# Patient Record
Sex: Male | Born: 1960
Health system: Southern US, Community
[De-identification: ages and names within clinical notes are randomized; demographics above are authoritative.]

## PROBLEM LIST (undated history)

## (undated) DIAGNOSIS — F329 Major depressive disorder, single episode, unspecified: Secondary | ICD-10-CM

## (undated) DIAGNOSIS — F319 Bipolar disorder, unspecified: Secondary | ICD-10-CM

## (undated) DIAGNOSIS — I739 Peripheral vascular disease, unspecified: Secondary | ICD-10-CM

## (undated) DIAGNOSIS — J020 Streptococcal pharyngitis: Secondary | ICD-10-CM

## (undated) DIAGNOSIS — E785 Hyperlipidemia, unspecified: Secondary | ICD-10-CM

## (undated) DIAGNOSIS — J189 Pneumonia, unspecified organism: Secondary | ICD-10-CM

## (undated) DIAGNOSIS — G459 Transient cerebral ischemic attack, unspecified: Secondary | ICD-10-CM

## (undated) DIAGNOSIS — N529 Male erectile dysfunction, unspecified: Secondary | ICD-10-CM

## (undated) DIAGNOSIS — K760 Fatty (change of) liver, not elsewhere classified: Secondary | ICD-10-CM

## (undated) DIAGNOSIS — K635 Polyp of colon: Secondary | ICD-10-CM

## (undated) DIAGNOSIS — F1910 Other psychoactive substance abuse, uncomplicated: Secondary | ICD-10-CM

## (undated) DIAGNOSIS — I2510 Atherosclerotic heart disease of native coronary artery without angina pectoris: Secondary | ICD-10-CM

## (undated) DIAGNOSIS — R7989 Other specified abnormal findings of blood chemistry: Secondary | ICD-10-CM

## (undated) DIAGNOSIS — H6690 Otitis media, unspecified, unspecified ear: Secondary | ICD-10-CM

## (undated) DIAGNOSIS — K219 Gastro-esophageal reflux disease without esophagitis: Secondary | ICD-10-CM

## (undated) DIAGNOSIS — F419 Anxiety disorder, unspecified: Secondary | ICD-10-CM

## (undated) DIAGNOSIS — E781 Pure hyperglyceridemia: Secondary | ICD-10-CM

## (undated) DIAGNOSIS — Z8489 Family history of other specified conditions: Secondary | ICD-10-CM

## (undated) DIAGNOSIS — F32A Depression, unspecified: Secondary | ICD-10-CM

## (undated) DIAGNOSIS — R519 Headache, unspecified: Secondary | ICD-10-CM

## (undated) DIAGNOSIS — I10 Essential (primary) hypertension: Secondary | ICD-10-CM

## (undated) DIAGNOSIS — R2232 Localized swelling, mass and lump, left upper limb: Secondary | ICD-10-CM

## (undated) HISTORY — PX: LAMINECTOMY: SHX219

## (undated) HISTORY — DX: Localized swelling, mass and lump, left upper limb: R22.32

## (undated) HISTORY — DX: Bipolar disorder, unspecified: F31.9

## (undated) HISTORY — DX: Hyperlipidemia, unspecified: E78.5

## (undated) HISTORY — PX: TONSILLECTOMY: SUR1361

## (undated) HISTORY — PX: HERNIA REPAIR: SHX51

## (undated) HISTORY — PX: APPENDECTOMY: SHX54

## (undated) HISTORY — DX: Streptococcal pharyngitis: J02.0

## (undated) HISTORY — DX: Headache, unspecified: R51.9

## (undated) HISTORY — DX: Other specified abnormal findings of blood chemistry: R79.89

## (undated) HISTORY — DX: Otitis media, unspecified, unspecified ear: H66.90

## (undated) HISTORY — DX: Gastro-esophageal reflux disease without esophagitis: K21.9

## (undated) HISTORY — DX: Polyp of colon: K63.5

## (undated) HISTORY — PX: OTHER SURGICAL HISTORY: SHX169

## (undated) HISTORY — DX: Male erectile dysfunction, unspecified: N52.9

## (undated) HISTORY — DX: Fatty (change of) liver, not elsewhere classified: K76.0

## (undated) HISTORY — DX: Atherosclerotic heart disease of native coronary artery without angina pectoris: I25.10

## (undated) HISTORY — DX: Pure hyperglyceridemia: E78.1

## (undated) HISTORY — DX: Transient cerebral ischemic attack, unspecified: G45.9

---

## 1999-10-16 ENCOUNTER — Encounter: Payer: Self-pay | Admitting: Family Medicine

## 1999-10-16 ENCOUNTER — Ambulatory Visit (HOSPITAL_COMMUNITY): Admission: RE | Admit: 1999-10-16 | Discharge: 1999-10-16 | Payer: Self-pay | Admitting: Family Medicine

## 2005-07-27 ENCOUNTER — Ambulatory Visit (HOSPITAL_COMMUNITY): Admission: RE | Admit: 2005-07-27 | Discharge: 2005-07-27 | Payer: Self-pay | Admitting: Orthopedic Surgery

## 2007-07-07 ENCOUNTER — Emergency Department (HOSPITAL_COMMUNITY): Admission: EM | Admit: 2007-07-07 | Discharge: 2007-07-07 | Payer: Self-pay | Admitting: Emergency Medicine

## 2010-10-11 ENCOUNTER — Encounter: Admission: RE | Admit: 2010-10-11 | Discharge: 2010-10-11 | Payer: Self-pay | Admitting: Family Medicine

## 2013-01-08 ENCOUNTER — Emergency Department (HOSPITAL_COMMUNITY)
Admission: EM | Admit: 2013-01-08 | Discharge: 2013-01-09 | Disposition: A | Discharge status: Other Acute Inpatient Hospital | Payer: 59 | Attending: Emergency Medicine | Admitting: Emergency Medicine

## 2013-01-08 ENCOUNTER — Encounter (HOSPITAL_COMMUNITY): Payer: Self-pay | Admitting: Emergency Medicine

## 2013-01-08 DIAGNOSIS — F319 Bipolar disorder, unspecified: Secondary | ICD-10-CM | POA: Insufficient documentation

## 2013-01-08 DIAGNOSIS — I10 Essential (primary) hypertension: Secondary | ICD-10-CM | POA: Insufficient documentation

## 2013-01-08 DIAGNOSIS — F314 Bipolar disorder, current episode depressed, severe, without psychotic features: Secondary | ICD-10-CM

## 2013-01-08 DIAGNOSIS — F329 Major depressive disorder, single episode, unspecified: Secondary | ICD-10-CM | POA: Insufficient documentation

## 2013-01-08 DIAGNOSIS — F3289 Other specified depressive episodes: Secondary | ICD-10-CM | POA: Insufficient documentation

## 2013-01-08 DIAGNOSIS — R45851 Suicidal ideations: Secondary | ICD-10-CM

## 2013-01-08 DIAGNOSIS — F3130 Bipolar disorder, current episode depressed, mild or moderate severity, unspecified: Secondary | ICD-10-CM

## 2013-01-08 HISTORY — DX: Depression, unspecified: F32.A

## 2013-01-08 HISTORY — DX: Major depressive disorder, single episode, unspecified: F32.9

## 2013-01-08 HISTORY — DX: Essential (primary) hypertension: I10

## 2013-01-08 LAB — CBC
HCT: 45.7 % (ref 39.0–52.0)
Hemoglobin: 15.9 g/dL (ref 13.0–17.0)
MCH: 32.1 pg (ref 26.0–34.0)
MCHC: 34.8 g/dL (ref 30.0–36.0)
MCV: 92.3 fL (ref 78.0–100.0)
Platelets: 254 10*3/uL (ref 150–400)
RBC: 4.95 MIL/uL (ref 4.22–5.81)
RDW: 12.2 % (ref 11.5–15.5)
WBC: 7.6 10*3/uL (ref 4.0–10.5)

## 2013-01-08 LAB — COMPREHENSIVE METABOLIC PANEL
ALT: 28 U/L (ref 0–53)
AST: 20 U/L (ref 0–37)
Albumin: 4 g/dL (ref 3.5–5.2)
Alkaline Phosphatase: 49 U/L (ref 39–117)
Calcium: 9.7 mg/dL (ref 8.4–10.5)
GFR calc Af Amer: >90 mL/min (ref >90)
Potassium: 4.8 mEq/L (ref 3.5–5.1)
Sodium: 137 mEq/L (ref 135–145)
Total Protein: 7.3 g/dL (ref 6.0–8.3)

## 2013-01-08 LAB — RAPID URINE DRUG SCREEN, HOSP PERFORMED
Amphetamines: NOT DETECTED
Barbiturates: NOT DETECTED
Benzodiazepines: NOT DETECTED
Cocaine: NOT DETECTED
Tetrahydrocannabinol: NOT DETECTED

## 2013-01-08 MED ORDER — IBUPROFEN 600 MG PO TABS
600.0000 mg | ORAL_TABLET | Freq: Three times a day (TID) | ORAL | Status: DC | PRN
  Administered 2013-01-08: 600 mg via ORAL
  Filled 2013-01-08: qty 1

## 2013-01-08 MED ORDER — ACETAMINOPHEN 325 MG PO TABS: 650.0000 mg | ORAL_TABLET | ORAL | Status: DC | PRN

## 2013-01-08 MED ORDER — LORAZEPAM 1 MG PO TABS
1.0000 mg | ORAL_TABLET | Freq: Three times a day (TID) | ORAL | Status: DC | PRN
  Administered 2013-01-08 (×2): 1 mg via ORAL
  Filled 2013-01-08 (×2): qty 1

## 2013-01-08 MED ORDER — ZOLPIDEM TARTRATE 5 MG PO TABS
5.0000 mg | ORAL_TABLET | Freq: Every evening | ORAL | Status: DC | PRN
  Administered 2013-01-08: 5 mg via ORAL
  Filled 2013-01-08: qty 1

## 2013-01-08 NOTE — ED Notes (Signed)
Report to Eric Rn.

## 2013-01-08 NOTE — ED Notes (Signed)
Pt presenting to ed with c/o suicidal thoughts pt states he was going to take a [place] baster needle and stick his femoral artery and get in the tub and bleed out. Pt states he also thought of hanging himself from the power cord in his attic. Pt states he has 3 words heart attack, stroke and cancer that he thinks would take him out and every morning he wakes up and wishes God would take him out. Pt states I don't pity anyone that has cancer I feel lucky for them pt states because I wish I was in that situation. pt states he's been off medications for a while. 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

## 2013-01-08 NOTE — ED Notes (Signed)
pts watch, wallet, wedding band and keys released to his sister per his request

## 2013-01-08 NOTE — BHH Counselor (Signed)
Per C.C. @[facility] , pt has been accepted for treatment.  Accepting physician, Dr. Betti Cruz.  Pt to be transported to [facility] for treatment, call report [phone].

## 2013-01-08 NOTE — Consult Note (Signed)
Reason for Consult: depression with suicidal ideations and plan Referring Physician: Dr. Wille Glaser Scott Stuart is an 52 y.o. male.  HPI: Patient was seen and chart reviewed. Patient has presented to the [facility] long emergency department from [facility] for depression and suicidal ideation with a plan. Patient reported he has been depressed over 2 weeks, stressed about his financial situation and nursing school at the [employer] which is stressful to him. Patient has lost of interest, isolation and a frequent and repeated suicidal thoughts. Patient has no previous history of suicide attempts. Patient has a history of for drinking alcohol, especially beer, drinks up from 10 beers a day to a case a day. Reportedly his father deceased at 08/01/10 who suffered with alcoholism. Patient has no previous the alcohol detox treatments or rehabilitation services. Patient was relocated from [place] to [place] in 1994 and briefly stayed in [place]. Patient has twice previously divorced and currently living with the his wife Olegario Messier since 1996. Patient has no children from this relationship. Patient has a 3 children, ages 61, 57 and 81 from the first relationship but has limited communication/ contact with them. Patient has one previous acute psychiatric hospitalization in [place] during 1994 after divorcing his second wife and lost his the job. 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 Patient was received outpatient psychiatric services [facility] the [facility] 2013, but noncompliant after 4 months.  MSE: Patient was well developed, well nourished and well built, Caucasian male, dressed in a hospital gown. Awake, alert, oriented to time, place, person and situation. He stated mood was depression sadness, lack of interest, feeling empty and his affect was bright and full. Has normal speech and thought process. He  has a suicidal ideation with the intention son plan of bleeding to death. Patient has knowledge about blood vessels. Patient has no homicidal ideation, intention, or plan is no evidence of psychotic symptoms.  Past Medical History  Diagnosis Date  . Depression   . Hypertension     Past Surgical History  Procedure Date  . Tonsillectomy   . Appendectomy     No family history on file.  Social History:  reports that he has never smoked. He does not have any smokeless tobacco history on file. He reports that he drinks alcohol. He reports that he does not use illicit drugs.  Allergies: No Known Allergies  Medications: I have reviewed the patient's current medications.  Results for orders placed during the hospital encounter of 01/08/13 (from the past 48 hour(s))  URINE RAPID DRUG SCREEN (HOSP PERFORMED)     Status: Normal   Collection Time   01/08/13 12:29 PM      Component Value Range Comment   Opiates NONE DETECTED  NONE DETECTED    Cocaine NONE DETECTED  NONE DETECTED    Benzodiazepines NONE DETECTED  NONE DETECTED    Amphetamines NONE DETECTED  NONE DETECTED    Tetrahydrocannabinol NONE DETECTED  NONE DETECTED    Barbiturates NONE DETECTED  NONE DETECTED   CBC     Status: Normal   Collection Time   01/08/13 12:50 PM      Component Value Range Comment   WBC 7.6  4.0 - 10.5 K/uL    RBC 4.95  4.22 - 5.81 MIL/uL    Hemoglobin 15.9  13.0 - 17.0 g/dL    HCT 16.1  09.6 - 04.5 %  MCV 92.3  78.0 - 100.0 fL    MCH 32.1  26.0 - 34.0 pg    MCHC 34.8  30.0 - 36.0 g/dL    RDW 98.1  19.1 - 47.8 %    Platelets 254  150 - 400 K/uL   COMPREHENSIVE METABOLIC PANEL     Status: Normal   Collection Time   01/08/13 12:50 PM      Component Value Range Comment   Sodium 137  135 - 145 mEq/L    Potassium 4.8  3.5 - 5.1 mEq/L    Chloride 104  96 - 112 mEq/L    CO2 22  19 - 32 mEq/L    Glucose, Bld 91  70 - 99 mg/dL    BUN 20  6 - 23 mg/dL    Creatinine, Ser 2.95  0.50 - 1.35 mg/dL     Calcium 9.7  8.4 - 10.5 mg/dL    Total Protein 7.3  6.0 - 8.3 g/dL    Albumin 4.0  3.5 - 5.2 g/dL    AST 20  0 - 37 U/L    ALT 28  0 - 53 U/L    Alkaline Phosphatase 49  39 - 117 U/L    Total Bilirubin 0.3  0.3 - 1.2 mg/dL    GFR calc non Af Amer >90  >90 mL/min    GFR calc Af Amer >90  >90 mL/min   ETHANOL     Status: Normal   Collection Time   01/08/13 12:50 PM      Component Value Range Comment   Alcohol, Ethyl (B) <11  0 - 11 mg/dL     No results found.  Positive for bad mood, bipolar, excessive alcohol consumption, mood swings, school difficulties and sleep disturbance Blood pressure 140/99, pulse 72, temperature 98.4 F (36.9 C), temperature source Oral, resp. rate 20, SpO2 100.00%.   Assessment/Plan: Bipolar disorder post recent episode is depression with suicidal ideation. Noncompliant with medications.  Recommended acute psychiatric hospitalization for crisis stabilization and appropriate medication management and outpatient psychiatric services.   Vail Basista,JANARDHAHA R. 01/08/2013, 4:10 PM

## 2013-01-08 NOTE — BH Assessment (Signed)
Assessment Note   Scott Stuart is a 52 y.o. male who presents to [facility] with SI/Depression/Anxiety.  Pt tells this Clinical research associate he has been praying for death via heart attack, cancer, or stroke.  Pt has been having SI thoughts x73yrs w/pan to hang self with extension cord and inject "[place] baster needle" in his femoral artery and bleed out.  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  Pt has been off meds since 07/2011 and recently returned to outpatient psychiatrist([facility]) on 01/08/13 who suggested that patient come to the ed for a psych eval.  Pt describes depression: anhedonia, inability to complete simple tasks or make decisions, isolation, daily crying spells.  Pt has past admissions with [facility] in [phone]).  Pt confirms---"I will eventually kill myself". Pt has good support from spouse, sister and brother-in-law.     Axis I: Major Depression, Recurrent severe Axis II: Deferred Axis III:  Past Medical History  Diagnosis Date  . Depression   . Hypertension    Axis IV: economic problems, educational problems, other psychosocial or environmental problems and problems related to social environment Axis V: 31-40 impairment in reality testing  Past Medical History:  Past Medical History  Diagnosis Date  . Depression   . Hypertension     Past Surgical History  Procedure Date  . Tonsillectomy   . Appendectomy     Family History: No family history on file.  Social History:  reports that he has never smoked. He does not have any smokeless tobacco history on file. He reports that he does not drink alcohol or use illicit drugs.  Additional Social History:  Alcohol / Drug Use Pain Medications: None  Prescriptions: None  Over the Counter: None  History of alcohol / drug use?: Yes Longest period of sobriety (when/how long): Past hx of heavy alcohol use   CIWA: CIWA-Ar BP: 139/83 mmHg Pulse Rate: 65  COWS:    Allergies: No Known  Allergies  Home Medications:  (Not in a hospital admission)  OB/GYN Status:  No LMP for male patient.  General Assessment Data Location of Assessment: [facility] Living Arrangements: Spouse/significant other (Lives with spouse ) Can pt return to current living arrangement?: Yes Admission Status: Voluntary Is patient capable of signing voluntary admission?: Yes Transfer from: Acute Hospital Referral Source: MD  Education Status Is patient currently in school?: Yes Current Grade: Community College  Highest grade of school patient has completed: High School  Name of school: Nurse, adult person: None   Risk to self Suicidal Ideation: Yes-Currently Present Suicidal Intent: Yes-Currently Present Is patient at risk for suicide?: Yes Suicidal Plan?: Yes-Currently Present Specify Current Suicidal Plan: Hang Self; Inject Needle into Femoral Atery(bleed out) Access to Means: Yes Specify Access to Suicidal Means: Ropes, Extension Cords, Sharps What has been your use of drugs/alcohol within the last 12 months?: Pt. Denies  Previous Attempts/Gestures: No (Thoughts only ) How many times?: 0  Other Self Harm Risks: None  Triggers for Past Attempts: None known Intentional Self Injurious Behavior: None Family Suicide History: Yes (Mother attemtped SI ) Recent stressful life event(s): Financial Problems;Other (Comment) (Issues with nursing school) Persecutory voices/beliefs?: No Depression: Yes Depression Symptoms: Tearfulness;Isolating;Loss of interest in usual pleasures;Feeling worthless/self pity Substance abuse history and/or treatment for substance abuse?: Yes Suicide prevention information given to non-admitted patients: Not applicable  Risk to Others Homicidal Ideation: No Thoughts of Harm to Others: No Current Homicidal Intent: No Current Homicidal Plan: No Access to Homicidal Means:  No Describe Access to Homicidal Means: None  Identified Victim: None  History of harm to  others?: No Assessment of Violence: None Noted Violent Behavior Description: None  Does patient have access to weapons?: No Criminal Charges Pending?: No Does patient have a court date: No  Psychosis Hallucinations: None noted Delusions: None noted  Mental Status Report Appear/Hygiene: Other (Comment) (Appropriate ) Eye Contact: Good Motor Activity: Unremarkable Speech: Logical/coherent;Pressured;Loud Level of Consciousness: Alert Mood: Depressed;Anxious;Sad Affect: Depressed;Sad Anxiety Level: Minimal Thought Processes: Coherent;Relevant Judgement: Impaired Orientation: Person;Place;Time;Situation Obsessive Compulsive Thoughts/Behaviors: None  Cognitive Functioning Concentration: Normal Memory: Recent Intact;Remote Intact IQ: Average Insight: Poor Impulse Control: Poor Appetite: Good Weight Loss: 0  Weight Gain: 0  Sleep: Decreased Total Hours of Sleep: 5  Vegetative Symptoms: None  ADLScreening [facility] Assessment Services) Patient's cognitive ability adequate to safely complete daily activities?: Yes Patient able to express need for assistance with ADLs?: Yes Independently performs ADLs?: Yes (appropriate for developmental age)  Abuse/Neglect [facility]) Physical Abuse: Denies Verbal Abuse: Yes, past (Comment) (Past hx from father ) Sexual Abuse: Denies  Prior Inpatient Therapy Prior Inpatient Therapy: Yes Prior Therapy Dates: 1992 Prior Therapy Facilty/Provider(s): [facility]  Reason for Treatment: Depression/SI   Prior Outpatient Therapy Prior Outpatient Therapy: Yes Prior Therapy Dates: Current  Prior Therapy Facilty/Provider(s): [facility]  Reason for Treatment: Med Mgt   ADL Screening (condition at time of admission) Patient's cognitive ability adequate to safely complete daily activities?: Yes Patient able to express need for assistance with ADLs?: Yes Independently performs ADLs?: Yes (appropriate for developmental age) Weakness of  Legs: None Weakness of Arms/Hands: None  Home Assistive Devices/Equipment Home Assistive Devices/Equipment: None  Therapy Consults (therapy consults require a physician order) PT Evaluation Needed: No OT Evalulation Needed: No SLP Evaluation Needed: No Abuse/Neglect Assessment (Assessment to be complete while patient is alone) Physical Abuse: Denies Verbal Abuse: Yes, past (Comment) (Past hx from father ) Sexual Abuse: Denies Exploitation of patient/patient's resources: Denies Self-Neglect: Denies Values / Beliefs Cultural Requests During Hospitalization: None Spiritual Requests During Hospitalization: None Consults Spiritual Care Consult Needed: No Social Work Consult Needed: No Merchant navy officer (For Healthcare) Advance Directive: Patient does not have advance directive;Patient would not like information Pre-existing out of facility DNR order (yellow form or pink MOST form): No Nutrition Screen- [facility] Patient's home diet: Regular Have you recently lost weight without trying?: No Have you been eating poorly because of a decreased appetite?: No Malnutrition Screening Tool Score: 0   Additional Information 1:1 In Past 12 Months?: No CIRT Risk: No Elopement Risk: No Does patient have medical clearance?: Yes     Disposition:  Disposition Disposition of Patient: Inpatient treatment program;Referred to [facility] ) Type of inpatient treatment program: Adult Patient referred to: Other (Comment) ([facility] )  On Site Evaluation by:   Reviewed with Physician:     Murrell Redden 01/08/2013 10:18 PM

## 2013-01-08 NOTE — ED Provider Notes (Signed)
History     CSN: [id number]  Arrival date & time 01/08/13  1158   First MD Initiated Contact with Patient 01/08/13 1210      Chief Complaint  Patient presents with  . Medical Clearance    (Consider location/radiation/quality/duration/timing/severity/associated sxs/prior treatment) The history is provided by the patient.  pt with hx bipolar disorder w feelings of depression and suicide for past few weeks. States has many stressors, school, money. Also has issues w etoh abuse, states drinks 1-2 cases of beer per week. When stops, denies problems w etoh withdrawal, shakes, seizures, nv or dts. No other substance abuse problems. 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 States has thought of ways to kill self, but never has tried to harm self. States has been on meds for same in past intermittently for brief intervals but has always become non compliant w rx. States recent physical health at baseline. No new c/o or symptoms.     Past Medical History  Diagnosis Date  . Depression   . Hypertension     Past Surgical History  Procedure Date  . Tonsillectomy   . Appendectomy     No family history on file.  History  Substance Use Topics  . Smoking status: Never Smoker   . Smokeless tobacco: Not on file  . Alcohol Use: Yes     Comment: weekends      Review of Systems  Constitutional: Negative for fever.  HENT: Negative for neck pain.   Eyes: Negative for redness.  Respiratory: Negative for shortness of breath.   Cardiovascular: Negative for chest pain.  Gastrointestinal: Negative for abdominal pain.  Genitourinary: Negative for flank pain.  Musculoskeletal: Negative for back pain.  Skin: Negative for rash.  Neurological: Negative for headaches.  Hematological: Does not bruise/bleed easily.  Psychiatric/Behavioral: Positive for dysphoric mood.    Allergies  Review of patient's allergies indicates no  known allergies.  Home Medications  No current outpatient prescriptions on file.  BP 140/99  Pulse 72  Temp 98.4 F (36.9 C) (Oral)  Resp 20  SpO2 100%  Physical Exam  Nursing note and vitals reviewed. Constitutional: He is oriented to person, place, and time. He appears well-developed and well-nourished. No distress.  HENT:  Nose: Nose normal.  Mouth/Throat: Oropharynx is clear and moist.  Eyes: Conjunctivae normal are normal. No scleral icterus.  Neck: Neck supple. No tracheal deviation present.  Cardiovascular: Normal rate, regular rhythm, normal heart sounds and intact distal pulses.   Pulmonary/Chest: Effort normal and breath sounds normal. No accessory muscle usage. No respiratory distress.  Abdominal: Soft. Bowel sounds are normal. He exhibits no distension. There is no tenderness.  Musculoskeletal: Normal range of motion. He exhibits no edema and no tenderness.  Neurological: He is alert and oriented to person, place, and time.  Skin: Skin is warm and dry.  Psychiatric:       Pt moves rapidly from one thought to next, rapid speech. States feels depressed and has suicidal thoughts.     ED Course  Procedures (including critical care time)  Results for orders placed during the hospital encounter of 01/08/13  CBC      Component Value Range   WBC 7.6  4.0 - 10.5 K/uL   RBC 4.95  4.22 - 5.81 MIL/uL   Hemoglobin 15.9  13.0 - 17.0 g/dL   HCT 40.9  81.1 - 91.4 %   MCV 92.3  78.0 -  100.0 fL   MCH 32.1  26.0 - 34.0 pg   MCHC 34.8  30.0 - 36.0 g/dL   RDW 11.9  14.7 - 82.9 %   Platelets 254  150 - 400 K/uL  COMPREHENSIVE METABOLIC PANEL      Component Value Range   Sodium 137  135 - 145 mEq/L   Potassium 4.8  3.5 - 5.1 mEq/L   Chloride 104  96 - 112 mEq/L   CO2 22  19 - 32 mEq/L   Glucose, Bld 91  70 - 99 mg/dL   BUN 20  6 - 23 mg/dL   Creatinine, Ser 5.62  0.50 - 1.35 mg/dL   Calcium 9.7  8.4 - 13.0 mg/dL   Total Protein 7.3  6.0 - 8.3 g/dL   Albumin 4.0  3.5 - 5.2  g/dL   AST 20  0 - 37 U/L   ALT 28  0 - 53 U/L   Alkaline Phosphatase 49  39 - 117 U/L   Total Bilirubin 0.3  0.3 - 1.2 mg/dL   GFR calc non Af Amer >90  >90 mL/min   GFR calc Af Amer >90  >90 mL/min  ETHANOL      Component Value Range   Alcohol, Ethyl (B) <11  0 - 11 mg/dL  URINE RAPID DRUG SCREEN (HOSP PERFORMED)      Component Value Range   Opiates NONE DETECTED  NONE DETECTED   Cocaine NONE DETECTED  NONE DETECTED   Benzodiazepines NONE DETECTED  NONE DETECTED   Amphetamines NONE DETECTED  NONE DETECTED   Tetrahydrocannabinol NONE DETECTED  NONE DETECTED   Barbiturates NONE DETECTED  NONE DETECTED        MDM  Labs.   Act called. Act and Dr Shela Commons to see.  Reviewed nursing notes and prior charts for additional history.   Will sign out to oncoming md that pt needs psych eval/placement.         Suzi Roots, MD 01/08/13 [phone]

## 2013-01-09 ENCOUNTER — Emergency Department (HOSPITAL_COMMUNITY): Payer: 59

## 2013-01-09 LAB — CBC WITH DIFFERENTIAL/PLATELET
Basophils Absolute: 0 10*3/uL (ref 0.0–0.1)
Basophils Relative: 0 % (ref 0–1)
HCT: 45.2 % (ref 39.0–52.0)
MCHC: 34.7 g/dL (ref 30.0–36.0)
Monocytes Absolute: 0.5 10*3/uL (ref 0.1–1.0)
Neutro Abs: 4.4 10*3/uL (ref 1.7–7.7)
Platelets: 229 10*3/uL (ref 150–400)
RDW: 12.3 % (ref 11.5–15.5)

## 2013-01-09 LAB — COMPREHENSIVE METABOLIC PANEL
AST: 17 U/L (ref 0–37)
Albumin: 3.5 g/dL (ref 3.5–5.2)
Calcium: 9.6 mg/dL (ref 8.4–10.5)
Chloride: 102 mEq/L (ref 96–112)
Creatinine, Ser: 1.08 mg/dL (ref 0.50–1.35)
Sodium: 137 mEq/L (ref 135–145)
Total Bilirubin: 0.4 mg/dL (ref 0.3–1.2)

## 2013-01-09 NOTE — ED Notes (Signed)
Passed out lunch trays 

## 2013-01-09 NOTE — ED Provider Notes (Signed)
Pt seen and examined, now with ruq pain, suspect cholelitihias , will order w/u  Toy Baker, MD 01/09/13 1105

## 2013-01-09 NOTE — ED Notes (Signed)
Dr allen into see 

## 2013-01-09 NOTE — ED Notes (Signed)
eating lunch, sister at bedside

## 2013-01-09 NOTE — ED Notes (Signed)
Picked up breakfast trays 

## 2013-01-09 NOTE — ED Notes (Signed)
Pt is aware that he will be having a Abd [place] and not to eat or drink anything until complete, ACT also aware, pt is pending for [facility]

## 2013-01-09 NOTE — ED Notes (Signed)
ACT into see 

## 2013-01-09 NOTE — ED Notes (Signed)
Pt ambuloatory to [facility]CU for [place], NT w/ pt

## 2013-01-09 NOTE — ED Notes (Signed)
[facility] contacted [place], ACT aware and into talk w/ pt

## 2013-01-09 NOTE — ED Notes (Signed)
Up on the phone 

## 2013-01-09 NOTE — ED Notes (Signed)
Up tot he bathroom to shower and change scrubs 

## 2013-01-09 NOTE — ED Notes (Signed)
Eating supper.

## 2013-01-09 NOTE — ED Notes (Signed)
Lab in to draw

## 2013-01-09 NOTE — ED Notes (Signed)
Up to the desk on the phone 

## 2013-01-09 NOTE — ED Provider Notes (Addendum)
Accepted at [facility].  The patient's wife will take him as [facility]tar was unable to justify this transport.  I spoke with Act who is comfortable with as well.  Geoffery Lyons, MD 01/09/13 1614  Geoffery Lyons, MD 01/09/13 1911

## 2013-01-09 NOTE — BHH Counselor (Signed)
Writer consulted with Dr. Celesta Aver, EDP and confirmed that the pt is allowed to be transported by his wife to [facility], permission was granted. Called [facility] and spoke with Christiane Ha and was told "as long as he is willing to sing himself in, it ok for his wife to bring him here. We're open 24/7 and would like him here before 12 midnight so the nurses can get him checked in". Writer spoke with the pt's wife and confirmed that she will drive the pt to [facility]. Pt was informed that his wife will drive him to [facility]. Pt will sign a no harm contract upon d/c. Ranae Pila, LCAS

## 2013-01-09 NOTE — ED Notes (Signed)
Pt dc'd w/ written dc instructions.  Pt instructed to go directly to [facility] for admission, no harm/violence  contart signed by pt.  Wife/friend to drive pt to [facility] and is here.  Elnita Maxwell at [facility] contacted and is aware that the pt will be leaving shortly and advised that pt need to go to intake  In the [place] building.  Pt and wife are aware, driving directions and contact numbers at [facility] given.  Belongings returned after leaving the unit.  Pt ambulatory w/o difficulty.  Wife and friend meet Pt in waiting room. Transfer chart/MAR/labs/face sheet in sealed envelope for pt take to [facility].

## 2013-01-09 NOTE — ED Notes (Signed)
Pt angry about transport problems, is aware that [facility] is working to resolve them.

## 2013-01-09 NOTE — ED Notes (Signed)
Wife is here for transport

## 2013-01-09 NOTE — ED Notes (Signed)
Pt's wife will transport pt to [facility] per ACT, pt will sign a Engineer, manufacturing systems, OK'd by EDP.  Pt calm, smiling, watching tv, and is aware

## 2013-10-17 ENCOUNTER — Emergency Department (INDEPENDENT_AMBULATORY_CARE_PROVIDER_SITE_OTHER)
Admission: EM | Admit: 2013-10-17 | Discharge: 2013-10-17 | Disposition: A | Discharge status: Home / Self Care | Payer: 59 | Source: Home / Self Care | Attending: Family Medicine | Admitting: Family Medicine

## 2013-10-17 ENCOUNTER — Encounter: Payer: Self-pay | Admitting: Emergency Medicine

## 2013-10-17 DIAGNOSIS — B309 Viral conjunctivitis, unspecified: Secondary | ICD-10-CM

## 2013-10-17 NOTE — ED Provider Notes (Signed)
CSN: [id number]     Arrival date & time 10/17/13  1339 History   First MD Initiated Contact with Patient 10/17/13 1501     Chief Complaint  Patient presents with  . Eye Drainage    some eye drainage for 1 day      HPI Comments: Patient states that he noticed mild painless redness in his left eye yesterday.  He denies a foreign body sensation, and is not sensitive to light.  He has had a slight increase in watery lacrimation from his left eye.  The symptoms have only partly improved with Visine.  No sore throat, nasal congestion, or other URI symptoms.  No history of seasonal rhinitis.  Patient is a 52 y.o. male presenting with conjunctivitis. The history is provided by the patient.  Conjunctivitis The current episode started yesterday. The problem occurs constantly. The problem has not changed since onset.Pertinent negatives include no headaches. Nothing aggravates the symptoms. Nothing relieves the symptoms. Treatments tried: Visine. The treatment provided mild relief.    Past Medical History  Diagnosis Date  . Depression   . Hypertension    Past Surgical History  Procedure Laterality Date  . Tonsillectomy    . Appendectomy     Family History  Problem Relation Age of Onset  . Stroke Father    History  Substance Use Topics  . Smoking status: Never Smoker   . Smokeless tobacco: Not on file  . Alcohol Use: No     Comment: No longer drinks alcohol     Review of Systems  Neurological: Negative for headaches.  All other systems reviewed and are negative.    Allergies  Review of patient's allergies indicates no known allergies.  Home Medications  No current outpatient prescriptions on file. BP 125/86  Pulse 74  Temp(Src) 98.2 F (36.8 C) (Oral)  Ht 6\' 1"  (1.854 m)  Wt 216 lb (97.977 kg)  BMI 28.5 kg/m2  SpO2 97% Physical Exam Nursing notes and Vital Signs reviewed. Appearance:  Patient appears healthy, stated age, and in no acute distress Eyes:  Pupils are equal,  round, and reactive to light and accomodation.  Extraocular movement is intact.  Left conjunctivae slightly hyperemic.  Right conjunctivae normal.  Fundi normal.  No eyelid tenderness or swelling.  Left lid eversion reveals no foreign bodies.  Fluorescein shows no uptake left cornea.  No photophobia. Ears:  Canals normal.  Tympanic membranes normal.  Nose:   Normal turbinates.  No sinus tenderness. Marland Kitchen  Pharynx:  Normal Neck:  Supple.  Right posterior nodes are slightly enlarged, and left posterior nodes are slightly enlarged and tender to palpation  Lungs:  Clear to auscultation.  Breath sounds are equal.  Heart:  Regular rate and rhythm without murmurs, rubs, or gallops.  Skin:  No rash present.   ED Course  Procedures  none       MDM   1. Viral conjunctivitis     May place refrigerated lubricating drops (e.g. "Refresh Tears") in eye several times daily. Followup with ophthalmologist if not improving 3 days.    Lattie Haw, MD 10/18/13 2014

## 2013-10-17 NOTE — ED Notes (Signed)
Scott Stuart complains of redness and some discharge in his left eye for 1 day. Denies fever, chills or sweats.

## 2013-10-18 ENCOUNTER — Encounter: Payer: Self-pay | Admitting: Emergency Medicine

## 2013-10-26 ENCOUNTER — Telehealth: Payer: Self-pay | Admitting: Emergency Medicine

## 2016-01-14 HISTORY — PX: TRIGGER FINGER RELEASE: SHX641

## 2016-11-02 DIAGNOSIS — M67442 Ganglion, left hand: Secondary | ICD-10-CM | POA: Insufficient documentation

## 2016-11-13 HISTORY — PX: FINGER GANGLION CYST EXCISION: SHX1636

## 2016-11-13 HISTORY — PX: STEROID INJECTION TO SCAR: SHX2447

## 2016-12-24 DIAGNOSIS — G459 Transient cerebral ischemic attack, unspecified: Secondary | ICD-10-CM

## 2016-12-24 HISTORY — DX: Transient cerebral ischemic attack, unspecified: G45.9

## 2017-05-25 LAB — POCT GLUCOSE (DEVICE FOR HOME USE)

## 2017-05-30 ENCOUNTER — Encounter: Payer: Self-pay | Admitting: Vascular Surgery

## 2017-05-31 ENCOUNTER — Encounter: Payer: Self-pay | Admitting: Vascular Surgery

## 2017-05-31 ENCOUNTER — Telehealth: Payer: Self-pay | Admitting: Vascular Surgery

## 2017-05-31 ENCOUNTER — Ambulatory Visit (INDEPENDENT_AMBULATORY_CARE_PROVIDER_SITE_OTHER): Payer: PRIVATE HEALTH INSURANCE | Admitting: Vascular Surgery

## 2017-05-31 VITALS — BP 150/92 | HR 92 | Temp 98.2°F | Resp 18 | Ht 73.0 in | Wt 215.0 lb

## 2017-05-31 DIAGNOSIS — I779 Disorder of arteries and arterioles, unspecified: Secondary | ICD-10-CM

## 2017-05-31 DIAGNOSIS — I739 Peripheral vascular disease, unspecified: Principal | ICD-10-CM

## 2017-05-31 HISTORY — DX: Disorder of arteries and arterioles, unspecified: I77.9

## 2017-05-31 NOTE — Telephone Encounter (Signed)
[facility] wanted pt's 08/14/17 appt with [facility] moved up. Called their department at [phone]. Spoke to a scheduler who sent a message to a nurse Baker Janus about moving up his appt. They said they would contact us and the pt.

## 2017-05-31 NOTE — Progress Notes (Signed)
New Carotid Patient  Requested by:  Hulan Fess, MD [place], [place] [address]  Reason for consultation: right carotid stenosis   History of Present Illness   Scott Stuart is a 56 y.o. 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  Roughly 2.5-3 months ago, the patient develops an episode dysarthria and bilateral visually blurring.  This resolved in <1 hour.  Since then he has multiple recurrent episodes of constitutional sx including diaphoresis with sweating from his arm, sense of illness throughout, without focal neurologic sx during these episodes.  There is no post-ictal behavior noted.  During the most recent event, he was hypertensive and tachycardiac during the episode and was sent down to the ED.  His work up included: MRI Head which did not demonstrate CVA, CTA neck which demonstrated a possible R ICA stenosis approaching 70% without significant disease in L ICA.  He was told that he had a TIA.  The patient has never had amaurosis fugax or monocular blindness.  The patient has never had facial drooping or hemiplegia.  The patient has never had receptive aphasia.     The patient's risks factors for carotid disease include: HLD, HTN, prior smoking.  Past Medical History:  Diagnosis Date  . Depression   . Hypertension   Hyper TG GERD ED Tubular adenoma colon polyp Fatty liver Pulsatile tinnits  Past Surgical History:  Procedure Laterality Date  . APPENDECTOMY    . TONSILLECTOMY      Social History   Social History  . Marital status: Married    Spouse name: N/A  . Number of children: N/A  . Years of education: N/A   Occupational History  . Not on file.   Social History Main Topics  . Smoking status: Never Smoker  . Smokeless tobacco: Never Used  . Alcohol use No     Comment: No longer drinks alcohol   . Drug use: No  . Sexual activity: Not on file   Other Topics Concern   . Not on file   Social History Narrative  . No narrative on file    Family History  Problem Relation Age of Onset  . Stroke Father     Current Outpatient Prescriptions  Medication Sig Dispense Refill  . aspirin EC 81 MG tablet Take 81 mg by mouth daily.    Marland Kitchen atorvastatin (LIPITOR) 20 MG tablet Take 20 mg by mouth daily.    Marland Kitchen buPROPion (WELLBUTRIN XL) 300 MG 24 hr tablet Take 300 mg by mouth daily.    . clopidogrel (PLAVIX) 75 MG tablet Take 75 mg by mouth daily.    Marland Kitchen lamoTRIgine (LAMICTAL) 200 MG tablet Take 200 mg by mouth 2 (two) times daily.    Marland Kitchen losartan (COZAAR) 25 MG tablet Take 25 mg by mouth daily.    Marland Kitchen lurasidone (LATUDA) 40 MG TABS tablet Take 40 mg by mouth daily with breakfast.     No current facility-administered medications for this visit.     No Known Allergies   REVIEW OF SYSTEMS (negative unless checked):   Cardiac:  []  Chest pain or chest pressure? []  Shortness of breath upon activity? []  Shortness of breath when lying flat? []  Irregular heart rhythm?  Vascular:  []  Pain in calf, thigh, or hip brought on by walking? []  Pain in feet at night that wakes you up from your sleep? []  Blood clot in your veins? []  Leg swelling?  Pulmonary:  []  Oxygen at home? []  Productive cough? []  Wheezing?  Neurologic:  []  Sudden weakness in arms or legs? []  Sudden numbness in arms or legs? [x]  Sudden onset of difficult speaking or slurred speech? []  Temporary loss of vision in one eye? [x]  Problems with dizziness?  Gastrointestinal:  []  Blood in stool? []  Vomited blood?  Genitourinary:  []  Burning when urinating? []  Blood in urine?  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 []  Problems with blood clotting?  Dermatologic:  []  Rashes or ulcers?  Constitutional:  []  Fever or chills?  Ear/Nose/Throat:  []  Change in hearing? []  Nose bleeds? []  Sore throat?  Musculoskeletal:  []  Back pain? []  Joint pain? []   Muscle pain?   For VQI Use Only   PRE-ADM LIVING Home  AMB STATUS Ambulatory  CAD Sx None  PRIOR CHF None  STRESS TEST No    Physical Examination     Vitals:   05/31/17 1506 05/31/17 1508 05/31/17 1509  BP: (!) 143/88 (!) 141/90 (!) 150/92  Pulse: 92 92 92  Resp: 18    Temp: 98.2 F (36.8 C)    SpO2: 98%    Weight: 215 lb (97.5 kg)    Height: 6\' 1"  (1.854 m)     Body mass index is 28.37 kg/m.  General Alert, O x 3, WD, NAD  Head [place]/AT,    Ear/Nose/ Throat Hearing grossly intact, nares without erythema or drainage, oropharynx without Erythema or Exudate, Mallampati score: 3,   Eyes PERRLA, EOMI,    Neck Supple, mid-line trachea,    Pulmonary Sym exp, good B air movt, CTA B  Cardiac RRR, Nl S1, S2, no Murmurs, No rubs, No S3,S4  Vascular Vessel Right Left  Radial Palpable Palpable  Brachial Palpable Palpable  Carotid Palpable, No Bruit Palpable, No Bruit  Aorta Not palpable N/A  Femoral Palpable Palpable  Popliteal Not palpable Not palpable  PT Palpable Palpable  DP Palpable Palpable    Gastro- intestinal soft, non-distended, non-tender to palpation, No guarding or rebound, no HSM, no masses, no CVAT B, No palpable prominent aortic pulse,    Musculo- skeletal M/S 5/5 throughout  , Extremities without ischemic changes  , No edema present, No visible varicosities , No Lipodermatosclerosis present  Neurologic Cranial nerves 2-12 intact , Pain and light touch intact in extremities , Motor exam as listed above  Psychiatric Judgement intact, Mood & affect appropriate for pt's clinical situation  Dermatologic See M/S exam for extremity exam, No rashes otherwise noted  Lymphatic  Palpable lymph nodes: None     Outside Studies/Documentation   20+ pages of outside documents were reviewed including: outside ED report, MRI, CTA neck reports, and PCP reports.   Medical Decision Making   Scott Stuart is a 57 y.o. male who presents with: atypical neurologic sx,  likely asx R ICA stenosis approaching 70%.   I don't have access to the CTA neck so I can't make comments on the CTA.  Regardless most right handed males have a L side speech center, which obviously does not correspond to the R ICA stenosis.  The report does not note a hemodynamically significant L ICA stenosis.  Additionally, this patient sx are not hard neurologic signs that would be consistent with classic TIA or CVA.  Subsequently, I don't really think this patient is having crescendo TIAs. Fortunately, pt already has Neuro c/s in progress.  Will see if can expedite given the recurrent nature of this  patient's sx. Suspect neuro w/u will include complex migraine, seizure disorder, and MS in the DDx. I discussed in depth with the patient the nature of atherosclerosis, and emphasized the importance of maximal medical management including strict control of blood pressure, blood glucose, and lipid levels, obtaining regular exercise, antiplatelet agents, and cessation of smoking.   The patient is currently on a statin: Lipitor.  The patient is currently on an anti-platelet: ASA and Plavix.  Pt is already on Lamictal.  The patient is aware that without maximal medical management the underlying atherosclerotic disease process will progress, limiting the benefit of any interventions.  Will have the pt follow up in 4 weeks to check on his progress.  Thank you for allowing [place] to participate in this patient's care.   Adele Barthel, MD, FACS [facility] Office: [phone] Pager: [phone]  05/31/2017, 5:45 PM

## 2017-06-03 ENCOUNTER — Telehealth: Payer: Self-pay | Admitting: Vascular Surgery

## 2017-06-03 NOTE — Telephone Encounter (Signed)
Spoke to [place] at [facility]. She said they are completely booked up until the end of Aug. They already have 18 pts on the wait list. She is going to speak to the pt's MD to see if they should send him to a headache specialist since neither the Neuro MD nor [facility] think the pt's is experiencing TIAs and his symptoms are a complex migraine. They will call the pt to inform them of new plan.

## 2017-06-27 DIAGNOSIS — G451 Carotid artery syndrome (hemispheric): Secondary | ICD-10-CM | POA: Insufficient documentation

## 2017-07-03 ENCOUNTER — Ambulatory Visit: Payer: PRIVATE HEALTH INSURANCE | Admitting: Vascular Surgery

## 2017-07-12 ENCOUNTER — Ambulatory Visit: Payer: PRIVATE HEALTH INSURANCE | Admitting: Vascular Surgery

## 2017-07-26 HISTORY — PX: CAROTID ENDARTERECTOMY: SUR193

## 2018-02-04 ENCOUNTER — Emergency Department (HOSPITAL_COMMUNITY): Payer: 59

## 2018-02-04 ENCOUNTER — Encounter (HOSPITAL_COMMUNITY): Payer: Self-pay | Admitting: *Deleted

## 2018-02-04 ENCOUNTER — Emergency Department (HOSPITAL_COMMUNITY)
Admission: EM | Admit: 2018-02-04 | Discharge: 2018-02-04 | Disposition: A | Discharge status: Home / Self Care | Payer: 59 | Attending: Emergency Medicine | Admitting: Emergency Medicine

## 2018-02-04 DIAGNOSIS — R2689 Other abnormalities of gait and mobility: Secondary | ICD-10-CM

## 2018-02-04 DIAGNOSIS — Z7982 Long term (current) use of aspirin: Secondary | ICD-10-CM | POA: Diagnosis not present

## 2018-02-04 DIAGNOSIS — Z79899 Other long term (current) drug therapy: Secondary | ICD-10-CM | POA: Insufficient documentation

## 2018-02-04 DIAGNOSIS — I10 Essential (primary) hypertension: Secondary | ICD-10-CM | POA: Diagnosis not present

## 2018-02-04 DIAGNOSIS — R42 Dizziness and giddiness: Secondary | ICD-10-CM

## 2018-02-04 LAB — I-STAT TROPONIN, ED
TROPONIN I, POC: 0 ng/mL (ref 0.00–0.08)
Troponin i, poc: 0.01 ng/mL (ref 0.00–0.08)

## 2018-02-04 LAB — COMPREHENSIVE METABOLIC PANEL
ALK PHOS: 58 U/L (ref 38–126)
ALT: 32 U/L (ref 17–63)
AST: 27 U/L (ref 15–41)
Albumin: 4.3 g/dL (ref 3.5–5.0)
Anion gap: 12 (ref 5–15)
BILIRUBIN TOTAL: 0.9 mg/dL (ref 0.3–1.2)
BUN: 16 mg/dL (ref 6–20)
CALCIUM: 9.4 mg/dL (ref 8.9–10.3)
CO2: 21 mmol/L — AB (ref 22–32)
CREATININE: 1.31 mg/dL — AB (ref 0.61–1.24)
Chloride: 103 mmol/L (ref 101–111)
GFR, EST NON AFRICAN AMERICAN: 59 mL/min — AB (ref >60)
Glucose, Bld: 105 mg/dL — ABNORMAL HIGH (ref 65–99)
Potassium: 4.4 mmol/L (ref 3.5–5.1)
Sodium: 136 mmol/L (ref 135–145)
Total Protein: 7.2 g/dL (ref 6.5–8.1)

## 2018-02-04 LAB — CBC
HEMATOCRIT: 46.6 % (ref 39.0–52.0)
Hemoglobin: 15.5 g/dL (ref 13.0–17.0)
MCH: 31.9 pg (ref 26.0–34.0)
MCHC: 33.3 g/dL (ref 30.0–36.0)
MCV: 95.9 fL (ref 78.0–100.0)
Platelets: 323 10*3/uL (ref 150–400)
RBC: 4.86 MIL/uL (ref 4.22–5.81)
RDW: 13 % (ref 11.5–15.5)
WBC: 9 10*3/uL (ref 4.0–10.5)

## 2018-02-04 LAB — DIFFERENTIAL
Basophils Absolute: 0 10*3/uL (ref 0.0–0.1)
Basophils Relative: 0 %
Eosinophils Absolute: 0.2 10*3/uL (ref 0.0–0.7)
Eosinophils Relative: 2 %
LYMPHS ABS: 2 10*3/uL (ref 0.7–4.0)
LYMPHS PCT: 22 %
MONO ABS: 0.8 10*3/uL (ref 0.1–1.0)
MONOS PCT: 9 %
NEUTROS ABS: 6 10*3/uL (ref 1.7–7.7)
Neutrophils Relative %: 67 %

## 2018-02-04 LAB — I-STAT CHEM 8, ED
BUN: 17 mg/dL (ref 6–20)
CALCIUM ION: 1.13 mmol/L — AB (ref 1.15–1.40)
CHLORIDE: 100 mmol/L — AB (ref 101–111)
Creatinine, Ser: 1.3 mg/dL — ABNORMAL HIGH (ref 0.61–1.24)
Glucose, Bld: 99 mg/dL (ref 65–99)
HCT: 46 % (ref 39.0–52.0)
Hemoglobin: 15.6 g/dL (ref 13.0–17.0)
Potassium: 4.3 mmol/L (ref 3.5–5.1)
SODIUM: 138 mmol/L (ref 135–145)
TCO2: 25 mmol/L (ref 22–32)

## 2018-02-04 LAB — PROTIME-INR
INR: 0.93
Prothrombin Time: 12.3 seconds (ref 11.4–15.2)

## 2018-02-04 LAB — CBG MONITORING, ED: Glucose-Capillary: 88 mg/dL (ref 65–99)

## 2018-02-04 LAB — APTT: aPTT: 34 seconds (ref 24–36)

## 2018-02-04 MED ORDER — GADOBENATE DIMEGLUMINE 529 MG/ML IV SOLN
20.0000 mL | Freq: Once | INTRAVENOUS | Status: AC
  Administered 2018-02-04: 20 mL via INTRAVENOUS

## 2018-02-04 MED ORDER — MECLIZINE HCL 25 MG PO TABS: 25.0000 mg | ORAL_TABLET | Freq: Three times a day (TID) | ORAL | 0 refills | Status: DC | PRN

## 2018-02-04 MED ORDER — MECLIZINE HCL 25 MG PO TABS
25.0000 mg | ORAL_TABLET | Freq: Once | ORAL | Status: AC
  Administered 2018-02-04: 25 mg via ORAL
  Filled 2018-02-04: qty 1

## 2018-02-04 NOTE — ED Provider Notes (Signed)
[facility] EMERGENCY DEPARTMENT Provider Note  CSN: [id number] Arrival date & time: 02/04/18 0800  Chief Complaint(s) Dizziness  HPI Scott Stuart is a 57 y.o. male with a history of hypertension who presents to the emergency department with feeling off balance.  This began abruptly at 9 PM last night and has persisted.  Patient reports similar episode in the past which was brief and self resolved.  This episode has been constant.  Symptoms are exacerbated with ambulation and turning head to the right.  No other alleviating or aggravating factors.  Endorses intermittent blurry vision and diplopia which she does not have at this time.  Denies any focal weakness.  Endorses some nausea without emesis.  Denies any chest pain or shortness of breath.  Denies any recent fevers or infections.  No trauma.  Denies any other physical complaints.  HPI  Past Medical History Past Medical History:  Diagnosis Date  . Depression   . Hypertension    Patient Active Problem List   Diagnosis Date Noted  . Right-sided carotid artery disease ([place]) 05/31/2017   Home Medication(s) Prior to Admission medications   Medication Sig Start Date End Date Taking? Authorizing Provider  ALPRAZolam Duanne Moron) 0.25 MG tablet Take 0.25 mg by mouth daily as needed for anxiety. 01/07/18  Yes [provider]  aspirin EC 81 MG tablet Take 81 mg by mouth every evening.    Yes [provider]  atorvastatin (LIPITOR) 40 MG tablet Take 40 mg by mouth every evening. 11/25/17  Yes [provider]  buPROPion (WELLBUTRIN XL) 300 MG 24 hr tablet Take 300 mg by mouth daily.   Yes [provider]  lamoTRIgine (LAMICTAL) 200 MG tablet Take 200 mg by mouth 2 (two) times daily.   Yes [provider]  losartan (COZAAR) 25 MG tablet Take 25 mg by mouth every evening.    Yes [provider]  lurasidone (LATUDA) 40 MG TABS tablet Take 40 mg by mouth every evening.     Yes [provider]  metoprolol tartrate (LOPRESSOR) 25 MG tablet Take 25 mg by mouth every evening.   Yes [provider]  Omega-3 Fatty Acids (FISH OIL PO) Take 1,000 mg by mouth 2 (two) times daily.   Yes [provider]  meclizine (ANTIVERT) 25 MG tablet Take 1 tablet (25 mg total) by mouth 3 (three) times daily as needed for dizziness. 02/04/18   Fatima Blank, MD                                                                                                                                    Past Surgical History Past Surgical History:  Procedure Laterality Date  . APPENDECTOMY    . TONSILLECTOMY     Family History Family History  Problem Relation Age of Onset  . Stroke Father     Social History Social History   Tobacco  Use  . Smoking status: Never Smoker  . Smokeless tobacco: Never Used  Substance Use Topics  . Alcohol use: No    Comment: No longer drinks alcohol   . Drug use: No   Allergies Patient has no known allergies.  Review of Systems Review of Systems All other systems are reviewed and are negative for acute change except as noted in the HPI  Physical Exam Vital Signs  I have reviewed the triage vital signs BP 133/86 (BP Location: Right Arm)   Pulse 70   Temp 98.8 F (37.1 C)   Resp 15   SpO2 97%   Physical Exam  Constitutional: He is oriented to person, place, and time. He appears well-developed and well-nourished. No distress.  HENT:  Head: Normocephalic and atraumatic.  Nose: Nose normal.  Eyes: Conjunctivae and EOM are normal. Pupils are equal, round, and reactive to light. Right eye exhibits no discharge. Left eye exhibits no discharge. No scleral icterus.  Neck: Normal range of motion. Neck supple.  Cardiovascular: Normal rate and regular rhythm. Exam reveals no gallop and no friction rub.  No murmur heard. Pulmonary/Chest: Effort normal and breath sounds normal. No stridor. No respiratory distress. He has  no rales.  Abdominal: Soft. He exhibits no distension. There is no tenderness.  Musculoskeletal: He exhibits no edema or tenderness.  Neurological: He is alert and oriented to person, place, and time.  Mental Status:  Alert and oriented to person, place, and time.  Attention and concentration normal.  Speech clear.  Recent memory is intact  Cranial Nerves:  II Visual Fields: Intact to confrontation. Visual fields intact. III, IV, VI: Pupils equal and reactive to light and near. Full eye movement with lateral nystagmus  V Facial Sensation: Normal. No weakness of masticatory muscles  VII: No facial weakness or asymmetry  VIII Auditory Acuity: Grossly normal  IX/X: The uvula is midline; the palate elevates symmetrically  XI: Normal sternocleidomastoid and trapezius strength  XII: The tongue is midline. No atrophy or fasciculations.   Motor System: Muscle Strength: 5/5 and symmetric in the upper and lower extremities. No pronation or drift.  Muscle Tone: Tone and muscle bulk are normal in the upper and lower extremities.   Reflexes: DTRs: 1+ and symmetrical in all four extremities. No Clonus Coordination: Intact finger-to-nose, heel-to-shin. No tremor.  Sensation: Intact to light touch, and pinprick.  Gait: deferred 2/2 symptoms  HINTS: Nystagmus: lateral Head impulse: normal Skew: normal   Skin: Skin is warm and dry. No rash noted. He is not diaphoretic. No erythema.  Psychiatric: He has a normal mood and affect.  Vitals reviewed.   ED Results and Treatments Labs (all labs ordered are listed, but only abnormal results are displayed) Labs Reviewed  COMPREHENSIVE METABOLIC PANEL - Abnormal; Notable for the following components:      Result Value   CO2 21 (*)    Glucose, Bld 105 (*)    Creatinine, Ser 1.31 (*)    GFR calc non Af Amer 59 (*)    All other components within normal limits  I-STAT CHEM 8, ED - Abnormal; Notable for the following components:   Chloride 100 (*)      Creatinine, Ser 1.30 (*)    Calcium, Ion 1.13 (*)    All other components within normal limits  PROTIME-INR  APTT  CBC  DIFFERENTIAL  I-STAT TROPONIN, ED  CBG MONITORING, ED  I-STAT TROPONIN, ED  EKG  EKG Interpretation  Date/Time:  Tuesday February 04 2018 12:06:40 EST Ventricular Rate:  73 PR Interval:  160 QRS Duration: 83 QT Interval:  399 QTC Calculation: 440 R Axis:   24 Text Interpretation:  Sinus rhythm Abnormal R-wave progression, early transition no significant change since earlier in the day Confirmed by Sherwood Gambler [phone]) on 02/05/2018 9:29:09 AM      Radiology No results found. Pertinent labs & imaging results that were available during my care of the patient were reviewed by me and considered in my medical decision making (see chart for details).  Medications Ordered in ED Medications  meclizine (ANTIVERT) tablet 25 mg (25 mg Oral Given 02/04/18 1404)  gadobenate dimeglumine (MULTIHANCE) injection 20 mL (20 mLs Intravenous Contrast Given 02/04/18 1513)                                                                                                                                    Procedures Procedures  (including critical care time)  Medical Decision Making / ED Course I have reviewed the nursing notes for this encounter and the patient's prior records (if available in EHR or on provided paperwork).    Vertiginous symptoms.  No focal deficits.  Hints exam not reassuring for peripheral process.  Patient progress for CVA given hypertension and no carotid stenosis status post endarterectomy.  MRI obtain which revealed no evidence of CVA or vascular process.  He was treated symptomatically with Valium and meclizine resulting in improved symptoms.  The patient appears reasonably screened and/or stabilized for discharge and I doubt any other  medical condition or other [facility] requiring further screening, evaluation, or treatment in the ED at this time prior to discharge.  The patient is safe for discharge with strict return precautions.   Final Clinical Impression(s) / ED Diagnoses Final diagnoses:  Imbalance  Vertigo   Disposition: Discharge  Condition: Good  I have discussed the results, Dx and Tx plan with the patient who expressed understanding and agree(s) with the plan. Discharge instructions discussed at great length. The patient was given strict return precautions who verbalized understanding of the instructions. No further questions at time of discharge.    ED Discharge Orders        Ordered    meclizine (ANTIVERT) 25 MG tablet  3 times daily PRN     02/04/18 1557       Follow Up: Hulan Fess, MD [place] [address] [phone]  Schedule an appointment as soon as possible for a visit  As needed      This chart was dictated using voice recognition software.  Despite best efforts to proofread,  errors can occur which can change the documentation meaning.   Fatima Blank, MD 02/05/18 1739

## 2018-02-04 NOTE — ED Notes (Signed)
ED Provider at bedside. 

## 2018-02-04 NOTE — ED Notes (Signed)
Pt over in MRI - c/o L chest pain/pressure, began suddenly. 5/10, non-radiating. Pt slightly diaphoretic, denies n/v or sob. Dr. Leonette Monarch made aware, will repeat EKG and Troponin

## 2018-02-04 NOTE — ED Triage Notes (Addendum)
Pt reports having dizziness starting last night at 9pm. Pt states that it has continued and he has difficulty walking. Pt states that he has had an episode of this in the past and related it to his latuda that he takes. Pt reports blurred and double vision. denies any weakness, numbness tingling.

## 2019-02-09 ENCOUNTER — Other Ambulatory Visit: Payer: Self-pay | Admitting: Family Medicine

## 2019-02-09 DIAGNOSIS — G4452 New daily persistent headache (NDPH): Secondary | ICD-10-CM

## 2019-02-10 ENCOUNTER — Telehealth: Payer: Self-pay | Admitting: *Deleted

## 2019-02-10 DIAGNOSIS — I739 Peripheral vascular disease, unspecified: Secondary | ICD-10-CM

## 2019-02-10 DIAGNOSIS — I779 Disorder of arteries and arterioles, unspecified: Secondary | ICD-10-CM

## 2019-02-10 DIAGNOSIS — R0683 Snoring: Secondary | ICD-10-CM

## 2019-02-10 NOTE — Telephone Encounter (Signed)
-----   Message from Rivka Barbara sent at 02/10/2019  9:25 AM EST ----- Regarding: Sleep Patient: Scott Stuart DOB 16-Mar-1961 Phone number [phone] Reason for Referral Snoring  Referred by Leighton Ruff  Preferred provider Fransico Him.

## 2019-02-13 ENCOUNTER — Telehealth: Payer: Self-pay

## 2019-02-13 NOTE — Telephone Encounter (Signed)
NOTES ON FILE 

## 2019-02-14 ENCOUNTER — Ambulatory Visit
Admission: RE | Admit: 2019-02-14 | Discharge: 2019-02-14 | Disposition: A | Discharge status: Home / Self Care | Payer: 59 | Source: Ambulatory Visit | Attending: Family Medicine | Admitting: Family Medicine

## 2019-02-14 DIAGNOSIS — G4452 New daily persistent headache (NDPH): Secondary | ICD-10-CM

## 2019-02-14 MED ORDER — GADOBENATE DIMEGLUMINE 529 MG/ML IV SOLN
20.0000 mL | Freq: Once | INTRAVENOUS | Status: AC | PRN
  Administered 2019-02-14: 20 mL via INTRAVENOUS

## 2019-02-27 NOTE — Telephone Encounter (Signed)
  Kordsmeier, Mary Sella, RN  Freada Bergeron, CMA        ----- Message -----  From: Sueanne Margarita, MD  Sent: 02/26/2019  9:27 PM EST  To: Sarina Ill, RN  Subject: RE: Sleep appointment               No his PCP referred him for sleep study. We can do a home sleep study   Traci  ----- Message -----  From: Sarina Ill, RN  Sent: 02/26/2019  4:36 PM EST  To: Sueanne Margarita, MD  Subject: Sleep appointment                 Hello Dr. Radford Pax,  Does Legrand Como need an office visit so we can order a sleep study?  Thanks,  [employer]

## 2019-02-27 NOTE — Telephone Encounter (Signed)
Patient is aware and agreeable to Home Sleep Study through [facility]. Patient is scheduled for 03/23/19 at 5:30 to pick up home sleep kit and meet with Respiratory therapist at [facility]. Patient is aware that if this appointment date and time does not work for them they should contact Artis Delay directly at [phone]. Patient is aware that a sleep packet will be sent from [facility] in week. Patient is agreeable to treatment and thankful for call.

## 2019-02-27 NOTE — Addendum Note (Signed)
Addended by: Freada Bergeron on: 02/27/2019 10:37 AM   Modules accepted: Orders

## 2019-02-27 NOTE — Addendum Note (Signed)
Addended by: Freada Bergeron on: 02/27/2019 11:11 AM   Modules accepted: Orders

## 2019-03-23 ENCOUNTER — Encounter (HOSPITAL_BASED_OUTPATIENT_CLINIC_OR_DEPARTMENT_OTHER): Payer: 59

## 2019-04-20 ENCOUNTER — Encounter (HOSPITAL_BASED_OUTPATIENT_CLINIC_OR_DEPARTMENT_OTHER): Payer: 59

## 2019-04-23 ENCOUNTER — Other Ambulatory Visit: Payer: Self-pay | Admitting: Family Medicine

## 2019-04-23 DIAGNOSIS — I779 Disorder of arteries and arterioles, unspecified: Secondary | ICD-10-CM

## 2019-04-23 DIAGNOSIS — I739 Peripheral vascular disease, unspecified: Principal | ICD-10-CM

## 2019-05-06 ENCOUNTER — Ambulatory Visit
Admission: RE | Admit: 2019-05-06 | Discharge: 2019-05-06 | Disposition: A | Discharge status: Home / Self Care | Payer: 59 | Source: Ambulatory Visit | Attending: Family Medicine | Admitting: Family Medicine

## 2019-05-06 ENCOUNTER — Other Ambulatory Visit: Payer: Self-pay

## 2019-05-06 DIAGNOSIS — I779 Disorder of arteries and arterioles, unspecified: Secondary | ICD-10-CM

## 2019-05-13 ENCOUNTER — Other Ambulatory Visit: Payer: Self-pay

## 2019-05-13 DIAGNOSIS — R931 Abnormal findings on diagnostic imaging of heart and coronary circulation: Secondary | ICD-10-CM

## 2019-05-13 NOTE — Progress Notes (Signed)
Dr. Johnsie Cancel has agreed to see patient has a referral from Dr. Eddie Dibbles office. Elevated Calcium score on CT. Will order lexiscan per Dr. Johnsie Cancel and will schedule appointment after test.

## 2019-05-14 ENCOUNTER — Telehealth (HOSPITAL_COMMUNITY): Payer: Self-pay

## 2019-05-14 NOTE — Telephone Encounter (Signed)
Spoke with the patient's wife. She stated that she understood and would give her husband the information. Also to call back with any questions. S.Daquisha Clermont EMTP

## 2019-05-19 ENCOUNTER — Ambulatory Visit (HOSPITAL_COMMUNITY): Payer: 59 | Attending: Cardiovascular Disease

## 2019-05-19 ENCOUNTER — Other Ambulatory Visit: Payer: Self-pay

## 2019-05-19 DIAGNOSIS — R931 Abnormal findings on diagnostic imaging of heart and coronary circulation: Secondary | ICD-10-CM | POA: Insufficient documentation

## 2019-05-19 LAB — MYOCARDIAL PERFUSION IMAGING
LV dias vol: 95 mL (ref 62–150)
LV sys vol: 44 mL
Peak HR: 100 {beats}/min
Rest HR: 71 {beats}/min
SDS: 2
SRS: 0
SSS: 2
TID: 1.07

## 2019-05-19 MED ORDER — TECHNETIUM TC 99M TETROFOSMIN IV KIT
10.6000 | PACK | Freq: Once | INTRAVENOUS | Status: AC | PRN
  Administered 2019-05-19: 10.6 via INTRAVENOUS
  Filled 2019-05-19: qty 11

## 2019-05-19 MED ORDER — REGADENOSON 0.4 MG/5ML IV SOLN
0.4000 mg | Freq: Once | INTRAVENOUS | Status: AC
  Administered 2019-05-19: 0.4 mg via INTRAVENOUS

## 2019-05-19 MED ORDER — TECHNETIUM TC 99M TETROFOSMIN IV KIT
32.9000 | PACK | Freq: Once | INTRAVENOUS | Status: AC | PRN
  Administered 2019-05-19: 32.9 via INTRAVENOUS
  Filled 2019-05-19: qty 33

## 2019-05-25 NOTE — Progress Notes (Signed)
Cardiology Consultation:   Patient ID: Scott Stuart MRN: [id number]; DOB: 06/06/1961  Admit date: (Not on file) Date of Consult: 05/27/2019  Primary Care Provider: Hulan Fess, MD Primary Cardiologist: Reola Calkins / Johnsie Cancel Primary Electrophysiologist:  None     History of Present Illness:    Scott Stuart is a 58 y.o. male with a hx of TIA and right CEA  who is being seen today for the evaluation of CAD at the request of Dr Rex Kras..CRF;s include vascular disease , family history and HTN.  He is a Marine scientist in our office I read his calcium score done 05/11/19 which was 97 or 35 th percentile for age and sex Most of the calcium was in the LAD. He subsequently had a lexiscan myovue ( no ETT due to [place] restrictions)  On 05/19/19 which was normal EF 54% He has significant anxiety No chest pain Fairly sedentary except for walking his Dondra Spry    Past Medical History:  Diagnosis Date  . Bipolar 1 disorder ([place])   . CAD (coronary artery disease)    status carotid endarterectomy  . Colon polyps   . Depression   . ED (erectile dysfunction)   . Fatty liver   . Frequent headaches   . GERD (gastroesophageal reflux disease)   . Hyperlipidemia   . Hypertension   . Hypertriglyceridemia   . Low testosterone   . Mass of finger of left hand   . Otitis media   . Right-sided carotid artery disease ([place]) 05/31/2017  . Strep throat   . TIA (transient ischemic attack)     Past Surgical History:  Procedure Laterality Date  . APPENDECTOMY    . CAROTID ENDARTERECTOMY Right 07/26/2017   neck  . colonscopy    . FINGER GANGLION CYST EXCISION Left 11/13/2016  . HERNIA REPAIR    . LAMINECTOMY    . LASIK Bilateral 05/21/2002  . STEROID INJECTION TO SCAR Right 11/13/2016   hip  . TONSILLECTOMY    . TRIGGER FINGER RELEASE Left 01/14/2016     Home Medications:  Prior to Admission medications   Medication Sig Start Date End Date Taking? Authorizing Provider  ALPRAZolam Duanne Moron) 0.25 MG  tablet Take 0.25 mg by mouth daily as needed for anxiety. 01/07/18   [provider]  aspirin EC 81 MG tablet Take 81 mg by mouth every evening.     [provider]  atorvastatin (LIPITOR) 40 MG tablet Take 40 mg by mouth every evening. 11/25/17   [provider]  buPROPion (WELLBUTRIN XL) 300 MG 24 hr tablet Take 300 mg by mouth daily.    [provider]  lamoTRIgine (LAMICTAL) 200 MG tablet Take 200 mg by mouth 2 (two) times daily.    [provider]  losartan (COZAAR) 25 MG tablet Take 25 mg by mouth every evening.     [provider]  lurasidone (LATUDA) 40 MG TABS tablet Take 40 mg by mouth every evening.     [provider]  meclizine (ANTIVERT) 25 MG tablet Take 1 tablet (25 mg total) by mouth 3 (three) times daily as needed for dizziness. 02/04/18   Fatima Blank, MD  metoprolol tartrate (LOPRESSOR) 25 MG tablet Take 25 mg by mouth every evening.    [provider]  Omega-3 Fatty Acids (FISH OIL PO) Take 1,000 mg by mouth 2 (two) times daily.    [provider]    Inpatient Medications: Scheduled Meds:  Continuous Infusions:  PRN Meds:  Allergies:   No Known Allergies  Social History:   Social History   Socioeconomic History  . Marital status: Married    Spouse name: Not on file  . Number of children: Not on file  . Years of education: Not on file  . Highest education level: Not on file  Occupational History  . Not on file  Social Needs  . Financial resource strain: Not on file  . Food insecurity:    Worry: Not on file    Inability: Not on file  . Transportation needs:    Medical: Not on file    Non-medical: Not on file  Tobacco Use  . Smoking status: Former Smoker    Types: Cigarettes    Last attempt to quit: 05/27/2019  . Smokeless tobacco: Never Used  Substance and Sexual Activity  . Alcohol use: No    Comment: No longer drinks alcohol   . Drug use: No  . Sexual activity:  Yes    Comment: married  Lifestyle  . Physical activity:    Days per week: Not on file    Minutes per session: Not on file  . Stress: Not on file  Relationships  . Social connections:    Talks on phone: Not on file    Gets together: Not on file    Attends religious service: Not on file    Active member of club or organization: Not on file    Attends meetings of clubs or organizations: Not on file    Relationship status: Not on file  . Intimate partner violence:    Fear of current or ex partner: Not on file    Emotionally abused: Not on file    Physically abused: Not on file    Forced sexual activity: Not on file  Other Topics Concern  . Not on file  Social History Narrative  . Not on file    Family History:    Family History  Problem Relation Age of Onset  . Stroke Father   . Hyperlipidemia Father   . Hypertension Father   . Anesthesia problems Mother   . Cancer Mother   . Cataracts Mother   . Depression Mother   . Other Mother        musclar degeneration  . Thyroid disease Mother   . Thyroid disease Sister   . Heart disease Paternal Grandfather      ROS:  Please see the history of present illness.   All other ROS reviewed and negative.     Physical Exam/Data:   Vitals:   05/27/19 1345  BP: 122/88  Pulse: 65  SpO2: 98%  Weight: 101.9 kg  Height: 6\' 1"  (1.854 m)   @IOBRIEF @ Last 3 Weights 05/27/2019 05/31/2017 10/17/2013  Weight (lbs) 224 lb 9.6 oz 215 lb 216 lb  Weight (kg) 101.878 kg 97.523 kg 97.977 kg     Body mass index is 29.63 kg/m.  General:  Anxious white male  HEENT: Post R CEA  Lymph: no adenopathy Neck: no JVD Endocrine:  No thryomegaly Vascular: No carotid bruits; FA pulses 2+ bilaterally without bruits  Cardiac:  normal S1, S2; RRR; no murmur   Lungs:  clear to auscultation bilaterally, no wheezing, rhonchi or rales  Abd: soft, nontender, no hepatomegaly  Ext: no edema Musculoskeletal:  No deformities, BUE and BLE strength normal and  equal Skin: warm and dry  Neuro:  CNs 2-12 intact, no focal abnormalities noted Psych:  Normal affect   EKG:  NSR rate 65 normal    Relevant CV Studies: Myovue 05/19/19 Calcium Score 05/06/19   Laboratory Data:  ChemistryNo results for input(s): NA, K, CL, CO2, GLUCOSE, BUN, CREATININE, CALCIUM, GFRNONAA, GFRAA, ANIONGAP in the last 168 hours.  No results for input(s): PROT, ALBUMIN, AST, ALT, ALKPHOS, BILITOT in the last 168 hours. HematologyNo results for input(s): WBC, RBC, HGB, HCT, MCV, MCH, MCHC, RDW, PLT in the last 168 hours. Cardiac EnzymesNo results for input(s): TROPONINI in the last 168 hours. No results for input(s): TROPIPOC in the last 168 hours.  BNPNo results for input(s): BNP, PROBNP in the last 168 hours.  DDimer No results for input(s): DDIMER in the last 168 hours.  Radiology/Studies:  No results found.  Assessment and Plan:   1. CAD:  Subclinical with high calcium score 419 for age no chest pain and normal pharmacologic stress test observe Normal ECG 2. HTN:  Well controlled.  Continue current medications and low sodium Dash type diet.   3. Anxiety:  Significant on lamictal latuda and welbutrin f/u primary / psychiatry  4. HLD:  Needs repeat labs in 3 months on statin target LDL 70 or less  5. PVD:  Post right CEA 2018 needs f/u duplex 9/21 has been [facility] at [facility]   For questions or updates, please contact [place] Please consult [email or web address] for contact info under     Signed, Jenkins Rouge, MD  05/27/2019 2:04 PM

## 2019-05-27 ENCOUNTER — Other Ambulatory Visit: Payer: Self-pay

## 2019-05-27 ENCOUNTER — Ambulatory Visit (INDEPENDENT_AMBULATORY_CARE_PROVIDER_SITE_OTHER): Payer: 59 | Admitting: Cardiovascular Disease

## 2019-05-27 VITALS — BP 122/88 | HR 65 | Ht 73.0 in | Wt 224.6 lb

## 2019-05-27 DIAGNOSIS — E785 Hyperlipidemia, unspecified: Secondary | ICD-10-CM

## 2019-05-27 DIAGNOSIS — I739 Peripheral vascular disease, unspecified: Secondary | ICD-10-CM | POA: Diagnosis not present

## 2019-05-27 DIAGNOSIS — R931 Abnormal findings on diagnostic imaging of heart and coronary circulation: Secondary | ICD-10-CM | POA: Diagnosis not present

## 2019-05-27 DIAGNOSIS — I779 Disorder of arteries and arterioles, unspecified: Secondary | ICD-10-CM

## 2019-05-27 NOTE — Patient Instructions (Addendum)
Medication Instructions:   If you need a refill on your cardiac medications before your next appointment, please call your pharmacy.   Lab work: Your physician recommends that you return for lab work in: 3 months for lipid and liver panel.  If you have labs (blood work) drawn today and your tests are completely normal, you will receive your results only by: Marland Kitchen MyChart Message (if you have MyChart) OR . A paper copy in the mail If you have any lab test that is abnormal or we need to change your treatment, we will call you to review the results.  Testing/Procedures: Your physician has requested that you have a carotid duplex in September 2021. This test is an ultrasound of the carotid arteries in your neck. It looks at blood flow through these arteries that supply the brain with blood. Allow one hour for this exam. There are no restrictions or special instructions.  Follow-Up: At [facility], you and your health needs are our priority.  As part of our continuing mission to provide you with exceptional heart care, we have created designated Provider Care Teams.  These Care Teams include your primary Cardiologist (physician) and Advanced Practice Providers (APPs -  Physician Assistants and Nurse Practitioners) who all work together to provide you with the care you need, when you need it. You will need a follow up appointment in 12  months.  Please call our office 2 months in advance to schedule this appointment.  You may see Dr. Johnsie Cancel or one of the following Advanced Practice Providers on your designated Care Team:   Truitt Merle, NP Cecilie Kicks, NP . Kathyrn Drown, NP

## 2019-08-14 ENCOUNTER — Telehealth: Payer: Self-pay | Admitting: *Deleted

## 2019-08-14 NOTE — Telephone Encounter (Signed)
Patient is aware and agreeable to Home Sleep Study through [facility]. Patient is scheduled for 9/4 at 5:30 to pick up home sleep kit and meet with Respiratory therapist at [facility]. Patient is aware that if this appointment date and time does not work for them they should contact Artis Delay directly at [phone]. Patient is aware that a sleep packet will be sent from [facility] in week. Patient is agreeable to treatment and thankful for call.

## 2019-08-17 ENCOUNTER — Telehealth: Payer: Self-pay | Admitting: *Deleted

## 2019-08-17 NOTE — Telephone Encounter (Signed)
-----  Message from Frederik Schmidt, RN sent at 08/17/2019  9:00 AM EDT ----- Regarding: RE: CORRECTION!!! Please cancel sleep test.  Thank you ----- Message ----- From: Freada Bergeron, CMA Sent: 08/14/2019   3:28 PM EDT To: Frederik Schmidt, RN Subject: CORRECTION!!!                                  Patient is scheduled for 9/4 at 5:30 to pick up home sleep kit and meet with Respiratory therapist at [facility].

## 2019-08-17 NOTE — Telephone Encounter (Signed)
Home sleep test cancelled per patient request.

## 2019-08-27 ENCOUNTER — Other Ambulatory Visit: Payer: 59

## 2019-08-28 ENCOUNTER — Encounter (HOSPITAL_BASED_OUTPATIENT_CLINIC_OR_DEPARTMENT_OTHER): Payer: 59 | Admitting: Cardiology

## 2019-10-01 DIAGNOSIS — I10 Essential (primary) hypertension: Secondary | ICD-10-CM | POA: Insufficient documentation

## 2019-10-01 DIAGNOSIS — Z9889 Other specified postprocedural states: Secondary | ICD-10-CM | POA: Insufficient documentation

## 2019-10-01 DIAGNOSIS — I2510 Atherosclerotic heart disease of native coronary artery without angina pectoris: Secondary | ICD-10-CM | POA: Insufficient documentation

## 2020-09-07 ENCOUNTER — Encounter (HOSPITAL_COMMUNITY): Payer: 59

## 2020-10-06 ENCOUNTER — Other Ambulatory Visit (HOSPITAL_BASED_OUTPATIENT_CLINIC_OR_DEPARTMENT_OTHER): Payer: Self-pay | Admitting: Surgical

## 2020-10-27 ENCOUNTER — Encounter: Payer: Self-pay | Admitting: Emergency Medicine

## 2020-10-27 ENCOUNTER — Emergency Department (INDEPENDENT_AMBULATORY_CARE_PROVIDER_SITE_OTHER)
Admission: EM | Admit: 2020-10-27 | Discharge: 2020-10-27 | Disposition: A | Discharge status: Home / Self Care | Payer: 59 | Source: Home / Self Care

## 2020-10-27 ENCOUNTER — Other Ambulatory Visit: Payer: Self-pay

## 2020-10-27 ENCOUNTER — Emergency Department (INDEPENDENT_AMBULATORY_CARE_PROVIDER_SITE_OTHER): Payer: 59

## 2020-10-27 DIAGNOSIS — M79672 Pain in left foot: Secondary | ICD-10-CM | POA: Diagnosis not present

## 2020-10-27 DIAGNOSIS — M25572 Pain in left ankle and joints of left foot: Secondary | ICD-10-CM

## 2020-10-27 DIAGNOSIS — S93402A Sprain of unspecified ligament of left ankle, initial encounter: Secondary | ICD-10-CM | POA: Diagnosis not present

## 2020-10-27 DIAGNOSIS — M25472 Effusion, left ankle: Secondary | ICD-10-CM | POA: Diagnosis not present

## 2020-10-27 NOTE — ED Triage Notes (Signed)
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

## 2020-10-27 NOTE — Discharge Instructions (Signed)
  You may take 500mg acetaminophen every 4-6 hours or in combination with ibuprofen 400-600mg every 6-8 hours as needed for pain and inflammation.   Call to schedule a follow up appointment with Sports Medicine in 1-2 weeks if not improving.  

## 2020-10-27 NOTE — ED Provider Notes (Signed)
[facility]    CSN: [id number] Arrival date & time: 10/27/20  5361      History   Chief Complaint Chief Complaint  Patient presents with  . Ankle Pain    left    HPI Scott Stuart is a 59 y.o. male.   HPI  Scott Stuart is a 59 y.o. male presenting to UC with c/o Left ankle pain and swelling radiating into Left foot that started about 1 week ago after he rolled his ankle. Pain is aching and sore. 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  Pain is gradually improving but the swelling has pt concerned.    Past Medical History:  Diagnosis Date  . Bipolar 1 disorder ([place])   . CAD (coronary artery disease)    status carotid endarterectomy  . Colon polyps   . Depression   . ED (erectile dysfunction)   . Fatty liver   . Frequent headaches   . GERD (gastroesophageal reflux disease)   . Hyperlipidemia   . Hypertension   . Hypertriglyceridemia   . Low testosterone   . Mass of finger of left hand   . Otitis media   . Right-sided carotid artery disease ([place]) 05/31/2017  . Strep throat   . TIA (transient ischemic attack)     Patient Active Problem List   Diagnosis Date Noted  . Benign essential hypertension 10/01/2019  . Atherosclerosis of native coronary artery of native heart without angina pectoris 10/01/2019  . History of carotid endarterectomy 10/01/2019  . Hemispheric carotid artery syndrome 06/27/2017  . Right-sided carotid artery disease ([place]) 05/31/2017  . Digital mucinous cyst of finger of left hand 11/02/2016    Past Surgical History:  Procedure Laterality Date  . APPENDECTOMY    . CAROTID ENDARTERECTOMY Right 07/26/2017   neck  . colonscopy    . FINGER GANGLION CYST EXCISION Left 11/13/2016  . HERNIA REPAIR    . LAMINECTOMY    . LASIK Bilateral 05/21/2002  . STEROID INJECTION TO SCAR Right 11/13/2016   hip  . TONSILLECTOMY     . TRIGGER FINGER RELEASE Left 01/14/2016       Home Medications    Prior to Admission medications   Medication Sig Start Date End Date Taking? Authorizing Provider  rosuvastatin (CRESTOR) 20 MG tablet Take by mouth. 10/06/20  Yes [provider]  ALPRAZolam (XANAX) 0.25 MG tablet Take 0.25 mg by mouth daily as needed for anxiety. 01/07/18   [provider]  atorvastatin (LIPITOR) 40 MG tablet Take 40 mg by mouth every evening. 11/25/17   [provider]  meclizine (ANTIVERT) 25 MG tablet Take 1 tablet (25 mg total) by mouth 3 (three) times daily as needed for dizziness. 02/04/18   Fatima Blank, MD    Family History Family History  Problem Relation Age of Onset  . Stroke Father   . Hyperlipidemia Father   . Hypertension Father   . Anesthesia problems Mother   . Cancer Mother   . Cataracts Mother   . Depression Mother   . Other Mother        musclar degeneration  . Thyroid disease Mother   . Thyroid disease Sister   . Heart disease Paternal Grandfather     Social History Social History   Tobacco Use  . Smoking status: Former Smoker    Types: Cigarettes  Quit date: 05/27/2019    Years since quitting: 1.4  . Smokeless tobacco: Never Used  Substance Use Topics  . Alcohol use: No    Comment: No longer drinks alcohol   . Drug use: No     Allergies   Patient has no known allergies.   Review of Systems Review of Systems  Musculoskeletal: Positive for arthralgias and joint swelling.  Skin: Negative for color change and wound.     Physical Exam Triage Vital Signs ED Triage Vitals  Enc Vitals Group     BP 10/27/20 1006 122/86     Pulse Rate 10/27/20 1006 86     Resp 10/27/20 1006 15     Temp 10/27/20 1006 99.3 F (37.4 C)     Temp Source 10/27/20 1006 Oral     SpO2 10/27/20 1006 97 %     Weight 10/27/20 1008 216 lb (98 kg)     Height 10/27/20 1008 6\' 1"  (1.854 m)     Head Circumference --      Peak Flow --      Pain  Score 10/27/20 1008 3     Pain Loc --      Pain Edu? --      Excl. in [place]? --    No data found.  Updated Vital Signs BP 122/86 (BP Location: Right Arm)   Pulse 86   Temp 99.3 F (37.4 C) (Oral)   Resp 15   Ht 6\' 1"  (1.854 m)   Wt 216 lb (98 kg)   SpO2 97%   BMI 28.50 kg/m   Visual Acuity Right Eye Distance:   Left Eye Distance:   Bilateral Distance:    Right Eye Near:   Left Eye Near:    Bilateral Near:     Physical Exam Vitals and nursing note reviewed.  Constitutional:      Appearance: Normal appearance. He is well-developed.  HENT:     Head: Normocephalic and atraumatic.  Cardiovascular:     Rate and Rhythm: Normal rate.  Pulmonary:     Effort: Pulmonary effort is normal.  Musculoskeletal:        General: Swelling and tenderness present. Normal range of motion.     Cervical back: Normal range of motion.     Comments: Left ankle: mild to moderate edema over lateral malleolus. Tenderness proximal to malleolus. Full ROM Calf is soft, non-tender.   Skin:    General: Skin is warm and dry.     Capillary Refill: Capillary refill takes less than 2 seconds.     Findings: No bruising or erythema.  Neurological:     Mental Status: He is alert and oriented to person, place, and time.     Sensory: No sensory deficit.  Psychiatric:        Behavior: Behavior normal.      UC Treatments / Results  Labs (all labs ordered are listed, but only abnormal results are displayed) Labs Reviewed - No data to display  EKG   Radiology DG Ankle Complete Left  Result Date: 10/27/2020 CLINICAL DATA:  Lateral left ankle pain and bruising following an injury 1 week ago. EXAM: LEFT ANKLE COMPLETE - 3+ VIEW COMPARISON:  Left foot radiographs obtained today. FINDINGS: Moderate lateral soft tissue swelling. Small effusion. Small, triangular-shaped, corticated bone fragment anterior to the distal tibia. No acute fracture seen. IMPRESSION: Small effusion and moderate lateral soft tissue  swelling without acute fracture. Electronically Signed   By: Percell Locus.D.  On: 10/27/2020 10:45   DG Foot Complete Left  Result Date: 10/27/2020 CLINICAL DATA:  Continued pain and initial bruising in the lateral left foot following an injury 1 week ago. EXAM: LEFT FOOT - COMPLETE 3+ VIEW COMPARISON:  Left ankle radiographs obtained at the same time. FINDINGS: A small, triangular-shaped, corticated bone fragment is again demonstrated anterior to the distal tibia. Minimal 1st MTP joint degenerative changes. No acute fracture or dislocation. IMPRESSION: No acute fracture or dislocation. Electronically Signed   By: Claudie Revering M.D.   On: 10/27/2020 10:46    Procedures Procedures (including critical care time)  Medications Ordered in UC Medications - No data to display  Initial Impression / Assessment and Plan / UC Course  I have reviewed the triage vital signs and the nursing notes.  Pertinent labs & imaging results that were available during my care of the patient were reviewed by me and considered in my medical decision making (see chart for details).    Reviewed imaging with pt Stirrup splint provided for comfort.  Encouraged f/u with Sports Medicine in 1-2 weeks if not improving AVS given  Final Clinical Impressions(s) / UC Diagnoses   Final diagnoses:  Moderate left ankle sprain, initial encounter     Discharge Instructions      You may take 500mg  acetaminophen every 4-6 hours or in combination with ibuprofen 400-600mg  every 6-8 hours as needed for pain and inflammation.  Call to schedule a follow up appointment with Sports Medicine in 1-2 weeks if not improving.     ED Prescriptions    None     PDMP not reviewed this encounter.   Noe Gens, [place] 10/27/20 1304

## 2021-01-19 MED FILL — ROSUVASTATIN CALCIUM 20 MG: 20 | 90 days supply | Qty: 90 | Fill #0

## 2021-01-19 MED FILL — KETOCONAZOLE 2 % CREA: 2 | 30 days supply | Qty: 90 | Fill #0

## 2021-02-02 MED FILL — ROSUVASTATIN CALCIUM 20 MG: 20 | 90 days supply | Qty: 90 | Fill #0

## 2021-07-13 DIAGNOSIS — Z125 Encounter for screening for malignant neoplasm of prostate: Secondary | ICD-10-CM | POA: Diagnosis not present

## 2021-07-13 DIAGNOSIS — N529 Male erectile dysfunction, unspecified: Secondary | ICD-10-CM | POA: Diagnosis not present

## 2021-07-13 DIAGNOSIS — I10 Essential (primary) hypertension: Secondary | ICD-10-CM | POA: Diagnosis not present

## 2021-07-13 DIAGNOSIS — E785 Hyperlipidemia, unspecified: Secondary | ICD-10-CM | POA: Diagnosis not present

## 2021-07-13 DIAGNOSIS — K2100 Gastro-esophageal reflux disease with esophagitis, without bleeding: Secondary | ICD-10-CM | POA: Diagnosis not present

## 2021-07-13 DIAGNOSIS — R361 Hematospermia: Secondary | ICD-10-CM | POA: Diagnosis not present

## 2021-07-13 DIAGNOSIS — Z0000 Encounter for general adult medical examination without abnormal findings: Secondary | ICD-10-CM | POA: Diagnosis not present

## 2021-07-13 DIAGNOSIS — Z1211 Encounter for screening for malignant neoplasm of colon: Secondary | ICD-10-CM | POA: Diagnosis not present

## 2021-07-13 DIAGNOSIS — E291 Testicular hypofunction: Secondary | ICD-10-CM | POA: Diagnosis not present

## 2021-10-03 ENCOUNTER — Other Ambulatory Visit (HOSPITAL_BASED_OUTPATIENT_CLINIC_OR_DEPARTMENT_OTHER): Payer: Self-pay

## 2021-10-03 DIAGNOSIS — N411 Chronic prostatitis: Secondary | ICD-10-CM | POA: Diagnosis not present

## 2021-10-03 DIAGNOSIS — R361 Hematospermia: Secondary | ICD-10-CM | POA: Diagnosis not present

## 2021-10-03 MED ORDER — TAMSULOSIN HCL 0.4 MG PO CAPS
ORAL_CAPSULE | ORAL | 0 refills | Status: DC
Start: 2021-10-03 — End: 2023-02-14
  Filled 2021-10-03: qty 45, 45d supply, fill #0

## 2021-10-03 MED ORDER — SILDENAFIL CITRATE 100 MG PO TABS
ORAL_TABLET | ORAL | 3 refills | Status: DC
Start: 2021-10-03 — End: 2023-02-14
  Filled 2021-10-03: qty 10, 10d supply, fill #0

## 2021-10-03 MED ORDER — MELOXICAM 15 MG PO TABS
ORAL_TABLET | ORAL | 1 refills | Status: DC
Start: 2021-10-03 — End: 2023-02-14
  Filled 2021-10-03: qty 30, 30d supply, fill #0

## 2021-12-08 ENCOUNTER — Other Ambulatory Visit (HOSPITAL_BASED_OUTPATIENT_CLINIC_OR_DEPARTMENT_OTHER): Payer: Self-pay

## 2021-12-08 MED ORDER — ROSUVASTATIN CALCIUM 20 MG PO TABS
20.0000 mg | ORAL_TABLET | Freq: Every day | ORAL | 3 refills | Status: DC
  Filled 2021-12-08: qty 90, 90d supply, fill #0
  Filled 2022-08-24: qty 90, 90d supply, fill #1

## 2021-12-12 ENCOUNTER — Other Ambulatory Visit (HOSPITAL_BASED_OUTPATIENT_CLINIC_OR_DEPARTMENT_OTHER): Payer: Self-pay

## 2021-12-12 DIAGNOSIS — K760 Fatty (change of) liver, not elsewhere classified: Secondary | ICD-10-CM | POA: Diagnosis not present

## 2021-12-12 DIAGNOSIS — R1013 Epigastric pain: Secondary | ICD-10-CM | POA: Diagnosis not present

## 2021-12-12 DIAGNOSIS — K219 Gastro-esophageal reflux disease without esophagitis: Secondary | ICD-10-CM | POA: Diagnosis not present

## 2021-12-12 DIAGNOSIS — E785 Hyperlipidemia, unspecified: Secondary | ICD-10-CM | POA: Diagnosis not present

## 2021-12-12 DIAGNOSIS — Z86010 Personal history of colonic polyps: Secondary | ICD-10-CM | POA: Diagnosis not present

## 2021-12-12 DIAGNOSIS — K625 Hemorrhage of anus and rectum: Secondary | ICD-10-CM | POA: Diagnosis not present

## 2021-12-12 MED ORDER — FAMOTIDINE 40 MG PO TABS
40.0000 mg | ORAL_TABLET | Freq: Two times a day (BID) | ORAL | 4 refills | Status: DC
Start: 2021-12-12 — End: 2023-03-08
  Filled 2021-12-12: qty 180, 90d supply, fill #0
  Filled 2022-09-26: qty 180, 90d supply, fill #1

## 2021-12-12 MED ORDER — SUCRALFATE 1 G PO TABS
ORAL_TABLET | ORAL | 0 refills | Status: DC
Start: 2021-12-12 — End: 2023-02-14
  Filled 2021-12-12: qty 120, 30d supply, fill #0

## 2021-12-22 DIAGNOSIS — R1013 Epigastric pain: Secondary | ICD-10-CM | POA: Diagnosis not present

## 2021-12-22 DIAGNOSIS — K2100 Gastro-esophageal reflux disease with esophagitis, without bleeding: Secondary | ICD-10-CM | POA: Diagnosis not present

## 2021-12-22 DIAGNOSIS — K2101 Gastro-esophageal reflux disease with esophagitis, with bleeding: Secondary | ICD-10-CM | POA: Diagnosis not present

## 2022-01-30 ENCOUNTER — Other Ambulatory Visit (HOSPITAL_BASED_OUTPATIENT_CLINIC_OR_DEPARTMENT_OTHER): Payer: Self-pay

## 2022-01-30 DIAGNOSIS — D225 Melanocytic nevi of trunk: Secondary | ICD-10-CM | POA: Diagnosis not present

## 2022-01-30 DIAGNOSIS — B360 Pityriasis versicolor: Secondary | ICD-10-CM | POA: Diagnosis not present

## 2022-01-30 DIAGNOSIS — L820 Inflamed seborrheic keratosis: Secondary | ICD-10-CM | POA: Diagnosis not present

## 2022-01-30 DIAGNOSIS — L821 Other seborrheic keratosis: Secondary | ICD-10-CM | POA: Diagnosis not present

## 2022-01-30 DIAGNOSIS — L814 Other melanin hyperpigmentation: Secondary | ICD-10-CM | POA: Diagnosis not present

## 2022-01-30 DIAGNOSIS — L578 Other skin changes due to chronic exposure to nonionizing radiation: Secondary | ICD-10-CM | POA: Diagnosis not present

## 2022-01-30 DIAGNOSIS — D235 Other benign neoplasm of skin of trunk: Secondary | ICD-10-CM | POA: Diagnosis not present

## 2022-01-30 DIAGNOSIS — Z23 Encounter for immunization: Secondary | ICD-10-CM | POA: Diagnosis not present

## 2022-01-30 MED ORDER — FLUCONAZOLE 200 MG PO TABS
ORAL_TABLET | ORAL | 0 refills | Status: DC
  Filled 2022-01-30: qty 4, 28d supply, fill #0

## 2022-01-30 MED ORDER — KETOCONAZOLE 2 % EX CREA
TOPICAL_CREAM | CUTANEOUS | 0 refills | Status: DC
  Filled 2022-01-30: qty 90, 30d supply, fill #0

## 2022-03-06 DIAGNOSIS — K760 Fatty (change of) liver, not elsewhere classified: Secondary | ICD-10-CM | POA: Diagnosis not present

## 2022-03-06 DIAGNOSIS — E669 Obesity, unspecified: Secondary | ICD-10-CM | POA: Diagnosis not present

## 2022-03-06 DIAGNOSIS — K2100 Gastro-esophageal reflux disease with esophagitis, without bleeding: Secondary | ICD-10-CM | POA: Diagnosis not present

## 2022-03-06 DIAGNOSIS — K219 Gastro-esophageal reflux disease without esophagitis: Secondary | ICD-10-CM | POA: Diagnosis not present

## 2022-03-06 DIAGNOSIS — Z86010 Personal history of colonic polyps: Secondary | ICD-10-CM | POA: Diagnosis not present

## 2022-04-13 ENCOUNTER — Other Ambulatory Visit (HOSPITAL_BASED_OUTPATIENT_CLINIC_OR_DEPARTMENT_OTHER): Payer: Self-pay

## 2022-04-14 ENCOUNTER — Other Ambulatory Visit (HOSPITAL_BASED_OUTPATIENT_CLINIC_OR_DEPARTMENT_OTHER): Payer: Self-pay

## 2022-04-14 MED ORDER — KETOCONAZOLE 2 % EX CREA
TOPICAL_CREAM | CUTANEOUS | 0 refills | Status: AC
  Filled 2022-04-14: qty 90, 45d supply, fill #0

## 2022-04-16 ENCOUNTER — Other Ambulatory Visit (HOSPITAL_BASED_OUTPATIENT_CLINIC_OR_DEPARTMENT_OTHER): Payer: Self-pay

## 2022-06-11 IMAGING — DX DG ANKLE COMPLETE 3+V*L*
3 series · 3 of 3 positions shown · non-contrast
Comparison: Left foot radiographs obtained today.

CLINICAL DATA: Lateral left ankle pain and bruising following an
injury 1 week ago.

EXAM:
LEFT ANKLE COMPLETE - 3+ VIEW

[ankle ap]
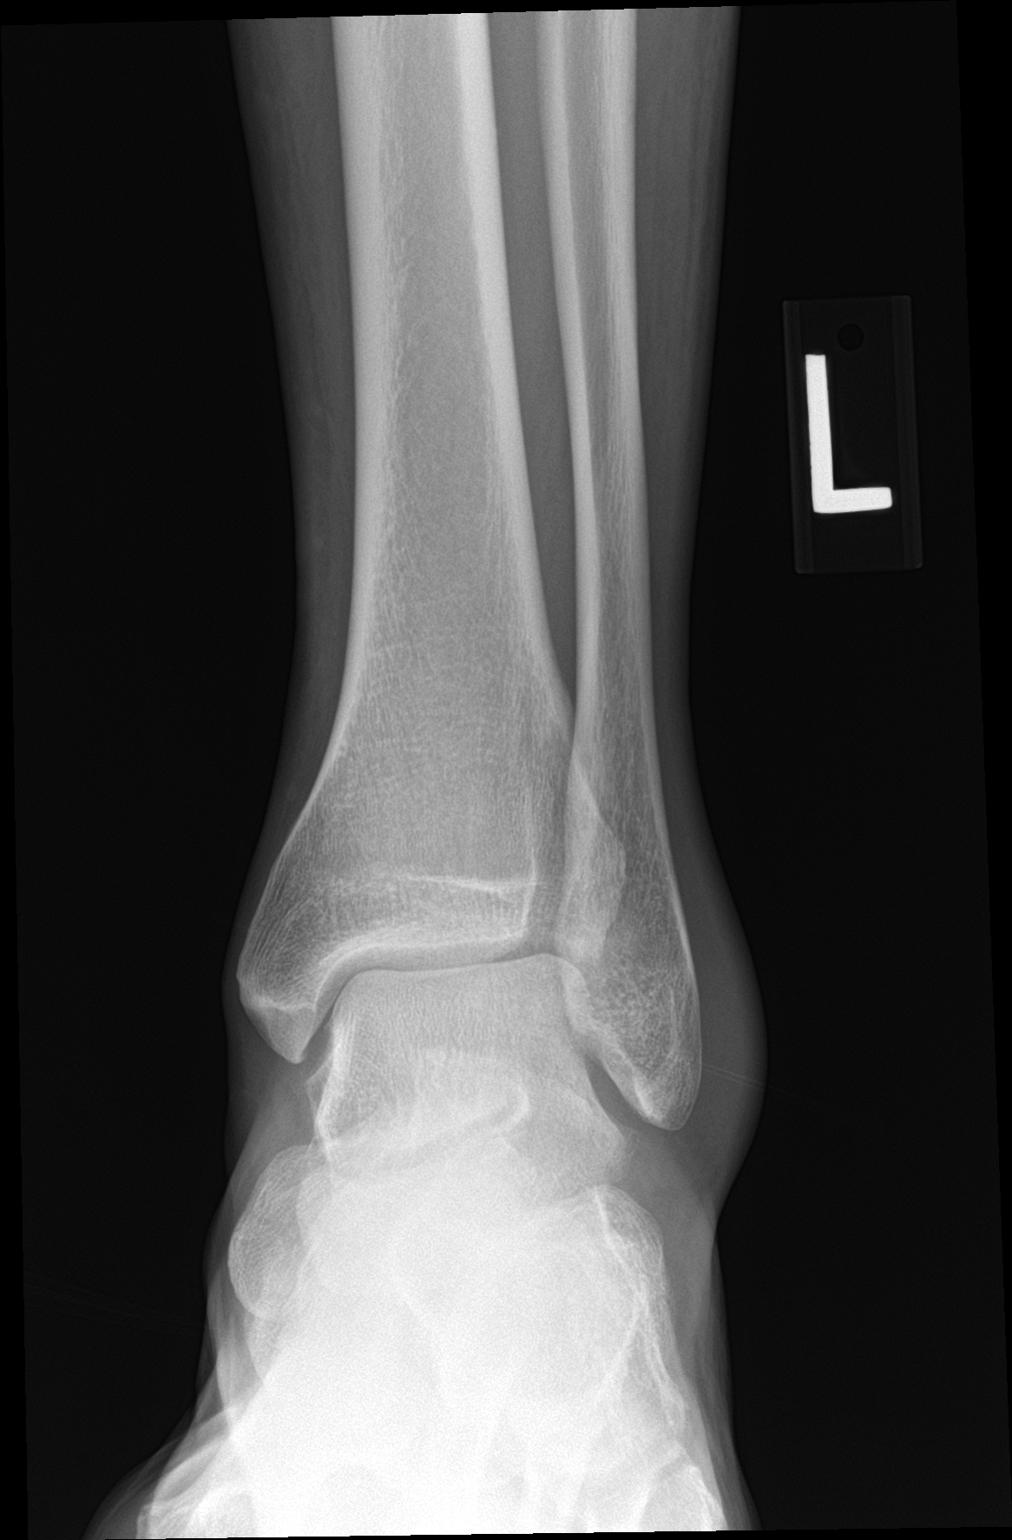

[ankle obl]
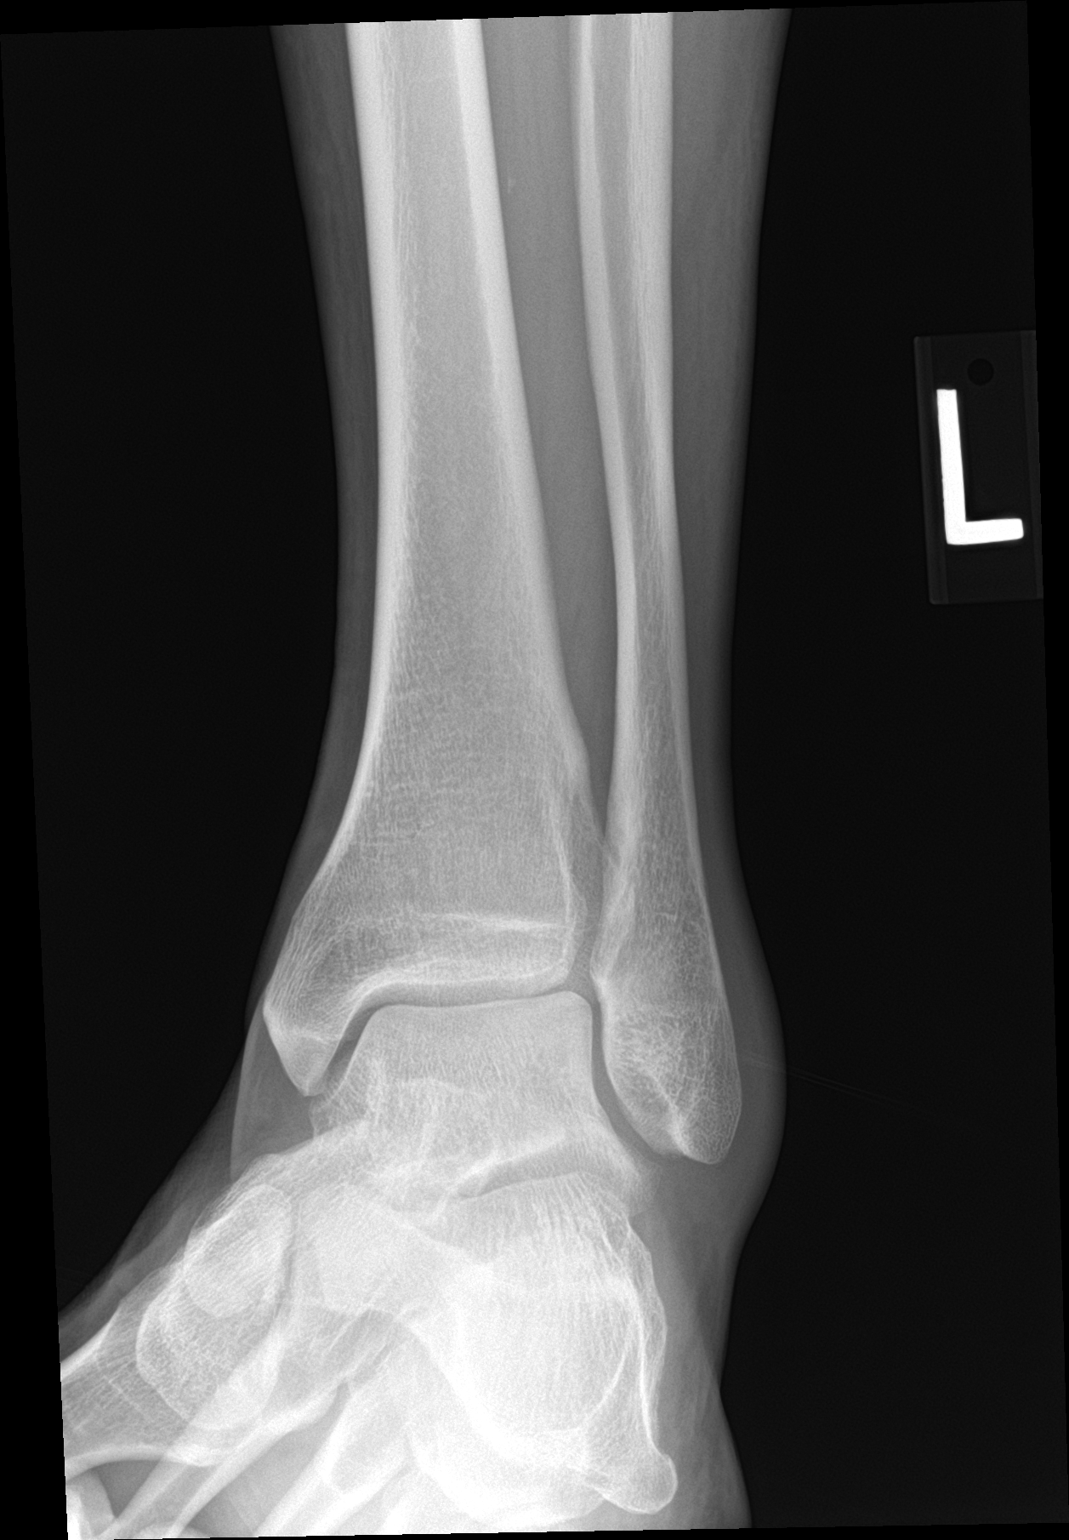

[ankle lat]
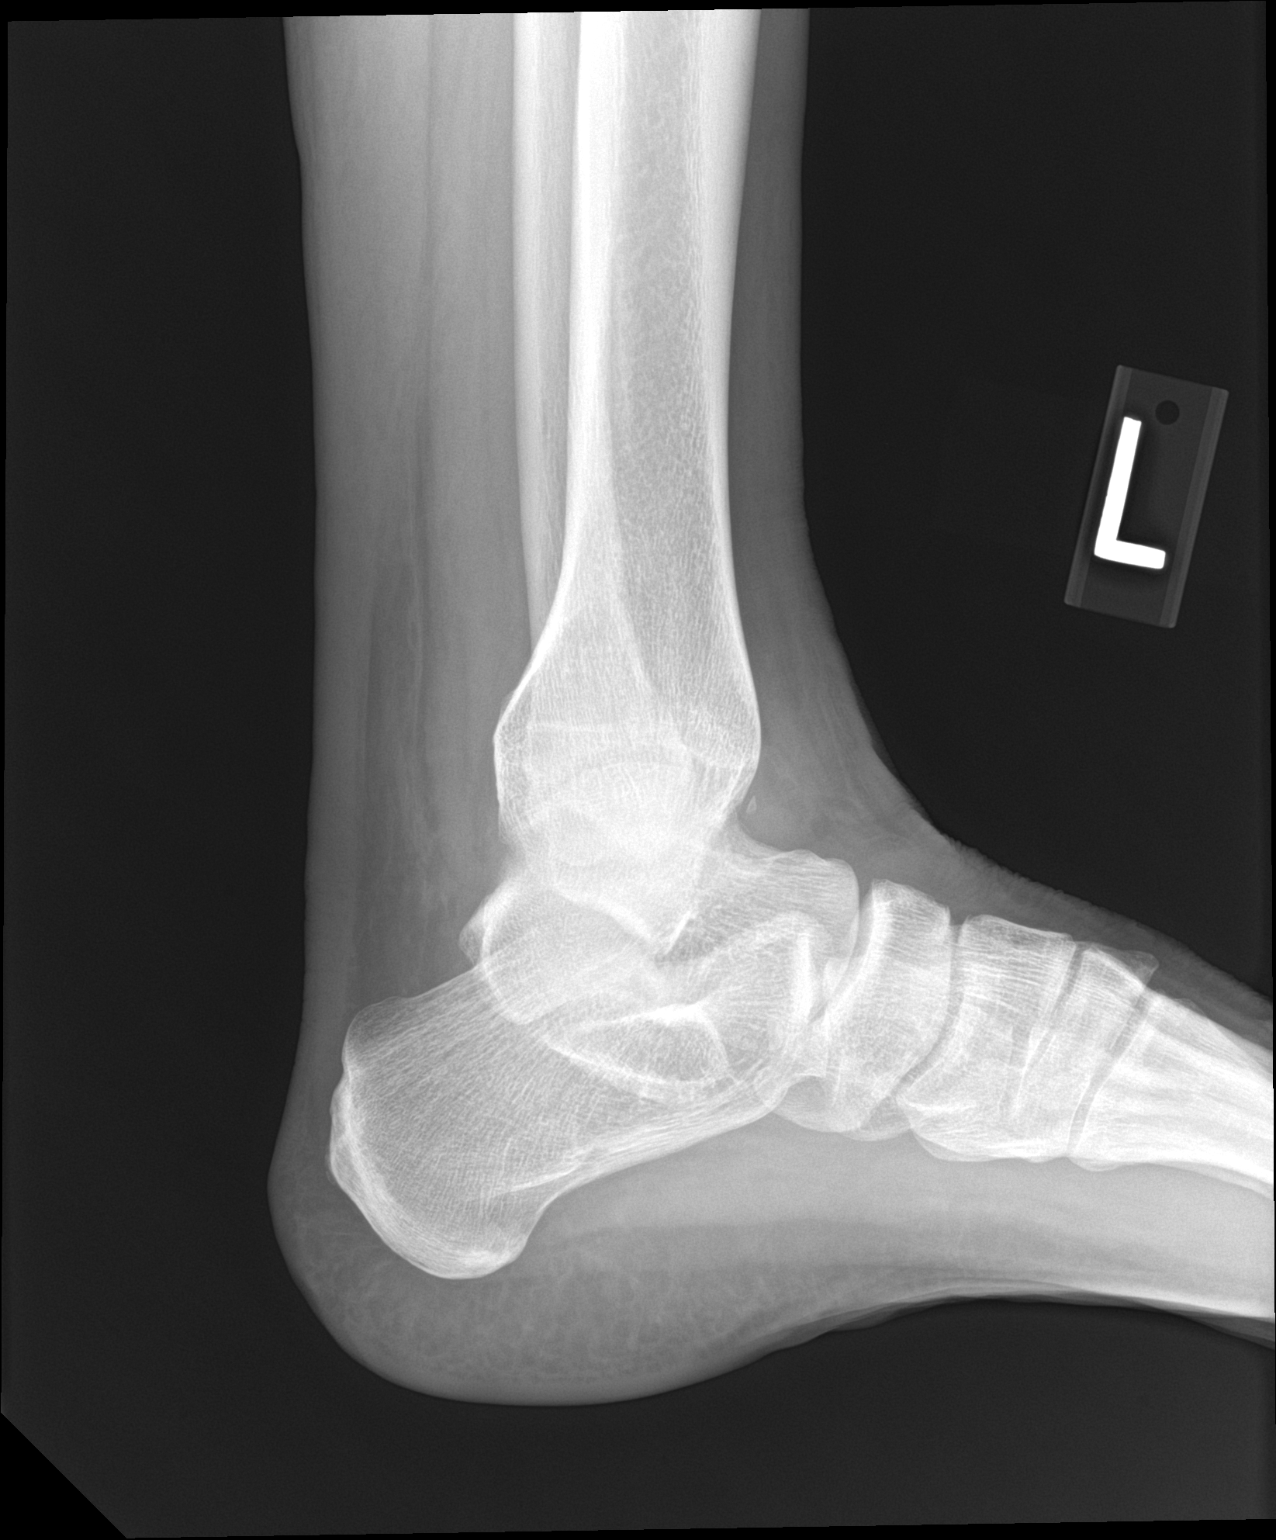

[3 of 3 positions shown; findings below may reference images not displayed]

FINDINGS: Moderate lateral soft tissue swelling. Small effusion. Small,
triangular-shaped, corticated bone fragment anterior to the distal
tibia. No acute fracture seen.
IMPRESSION: Small effusion and moderate lateral soft tissue swelling without
acute fracture.

## 2022-06-14 ENCOUNTER — Other Ambulatory Visit (HOSPITAL_BASED_OUTPATIENT_CLINIC_OR_DEPARTMENT_OTHER): Payer: Self-pay

## 2022-06-14 DIAGNOSIS — L739 Follicular disorder, unspecified: Secondary | ICD-10-CM | POA: Diagnosis not present

## 2022-06-14 MED ORDER — CLINDAMYCIN PHOSPHATE 1 % EX SOLN
CUTANEOUS | 1 refills | Status: DC
  Filled 2022-06-14: qty 60, 30d supply, fill #0

## 2022-07-03 DIAGNOSIS — Z6829 Body mass index (BMI) 29.0-29.9, adult: Secondary | ICD-10-CM | POA: Diagnosis not present

## 2022-07-03 DIAGNOSIS — E291 Testicular hypofunction: Secondary | ICD-10-CM | POA: Diagnosis not present

## 2022-07-03 DIAGNOSIS — R7989 Other specified abnormal findings of blood chemistry: Secondary | ICD-10-CM | POA: Diagnosis not present

## 2022-07-03 DIAGNOSIS — Z0000 Encounter for general adult medical examination without abnormal findings: Secondary | ICD-10-CM | POA: Diagnosis not present

## 2022-07-03 DIAGNOSIS — I779 Disorder of arteries and arterioles, unspecified: Secondary | ICD-10-CM | POA: Diagnosis not present

## 2022-07-03 DIAGNOSIS — G459 Transient cerebral ischemic attack, unspecified: Secondary | ICD-10-CM | POA: Diagnosis not present

## 2022-07-03 DIAGNOSIS — I10 Essential (primary) hypertension: Secondary | ICD-10-CM | POA: Diagnosis not present

## 2022-07-03 DIAGNOSIS — Z1211 Encounter for screening for malignant neoplasm of colon: Secondary | ICD-10-CM | POA: Diagnosis not present

## 2022-07-03 DIAGNOSIS — E785 Hyperlipidemia, unspecified: Secondary | ICD-10-CM | POA: Diagnosis not present

## 2022-07-03 DIAGNOSIS — Z125 Encounter for screening for malignant neoplasm of prostate: Secondary | ICD-10-CM | POA: Diagnosis not present

## 2022-08-24 ENCOUNTER — Other Ambulatory Visit (HOSPITAL_BASED_OUTPATIENT_CLINIC_OR_DEPARTMENT_OTHER): Payer: Self-pay

## 2022-09-26 ENCOUNTER — Other Ambulatory Visit (HOSPITAL_BASED_OUTPATIENT_CLINIC_OR_DEPARTMENT_OTHER): Payer: Self-pay

## 2022-10-04 ENCOUNTER — Other Ambulatory Visit (HOSPITAL_BASED_OUTPATIENT_CLINIC_OR_DEPARTMENT_OTHER): Payer: Self-pay

## 2022-10-04 DIAGNOSIS — E291 Testicular hypofunction: Secondary | ICD-10-CM | POA: Diagnosis not present

## 2022-10-04 DIAGNOSIS — R361 Hematospermia: Secondary | ICD-10-CM | POA: Diagnosis not present

## 2022-10-04 DIAGNOSIS — N529 Male erectile dysfunction, unspecified: Secondary | ICD-10-CM | POA: Diagnosis not present

## 2022-10-04 MED ORDER — TADALAFIL 5 MG PO TABS
5.0000 mg | ORAL_TABLET | Freq: Every day | ORAL | 3 refills | Status: DC
  Filled 2022-10-04: qty 90, 90d supply, fill #0

## 2022-10-05 ENCOUNTER — Other Ambulatory Visit: Payer: Self-pay | Admitting: Urology

## 2022-10-05 DIAGNOSIS — R361 Hematospermia: Secondary | ICD-10-CM

## 2022-10-12 DIAGNOSIS — E291 Testicular hypofunction: Secondary | ICD-10-CM | POA: Diagnosis not present

## 2022-10-27 ENCOUNTER — Inpatient Hospital Stay: Admission: RE | Admit: 2022-10-27 | Payer: 59 | Source: Ambulatory Visit

## 2022-11-22 ENCOUNTER — Ambulatory Visit
Admission: RE | Admit: 2022-11-22 | Discharge: 2022-11-22 | Disposition: A | Discharge status: Home / Self Care | Payer: 59 | Source: Ambulatory Visit | Attending: Urology | Admitting: Urology

## 2022-11-22 DIAGNOSIS — R361 Hematospermia: Secondary | ICD-10-CM | POA: Diagnosis not present

## 2022-11-22 MED ORDER — GADOPICLENOL 0.5 MMOL/ML IV SOLN
10.0000 mL | Freq: Once | INTRAVENOUS | Status: AC | PRN
  Administered 2022-11-22: 10 mL via INTRAVENOUS

## 2022-11-29 ENCOUNTER — Other Ambulatory Visit (HOSPITAL_BASED_OUTPATIENT_CLINIC_OR_DEPARTMENT_OTHER): Payer: Self-pay

## 2022-11-29 DIAGNOSIS — N529 Male erectile dysfunction, unspecified: Secondary | ICD-10-CM | POA: Diagnosis not present

## 2022-11-29 DIAGNOSIS — E291 Testicular hypofunction: Secondary | ICD-10-CM | POA: Diagnosis not present

## 2022-11-29 DIAGNOSIS — R361 Hematospermia: Secondary | ICD-10-CM | POA: Diagnosis not present

## 2022-11-29 MED ORDER — TESTOSTERONE 20.25 MG/ACT (1.62%) TD GEL
2.0000 | Freq: Every morning | TRANSDERMAL | 3 refills | Status: DC
  Filled 2022-11-29: qty 75, 30d supply, fill #0

## 2022-11-29 MED ORDER — SILDENAFIL CITRATE 100 MG PO TABS
50.0000 mg | ORAL_TABLET | ORAL | 3 refills | Status: DC
  Filled 2022-11-29: qty 10, 40d supply, fill #0

## 2022-12-03 ENCOUNTER — Other Ambulatory Visit (HOSPITAL_BASED_OUTPATIENT_CLINIC_OR_DEPARTMENT_OTHER): Payer: Self-pay

## 2022-12-04 ENCOUNTER — Other Ambulatory Visit (HOSPITAL_BASED_OUTPATIENT_CLINIC_OR_DEPARTMENT_OTHER): Payer: Self-pay

## 2022-12-06 ENCOUNTER — Other Ambulatory Visit (HOSPITAL_BASED_OUTPATIENT_CLINIC_OR_DEPARTMENT_OTHER): Payer: Self-pay

## 2022-12-07 ENCOUNTER — Other Ambulatory Visit (HOSPITAL_BASED_OUTPATIENT_CLINIC_OR_DEPARTMENT_OTHER): Payer: Self-pay

## 2022-12-18 ENCOUNTER — Other Ambulatory Visit (HOSPITAL_BASED_OUTPATIENT_CLINIC_OR_DEPARTMENT_OTHER): Payer: Self-pay

## 2023-01-03 DIAGNOSIS — E291 Testicular hypofunction: Secondary | ICD-10-CM | POA: Diagnosis not present

## 2023-01-03 DIAGNOSIS — Z125 Encounter for screening for malignant neoplasm of prostate: Secondary | ICD-10-CM | POA: Diagnosis not present

## 2023-01-11 ENCOUNTER — Other Ambulatory Visit (HOSPITAL_BASED_OUTPATIENT_CLINIC_OR_DEPARTMENT_OTHER): Payer: Self-pay

## 2023-01-11 MED ORDER — TESTOSTERONE 20.25 MG/ACT (1.62%) TD GEL
3.0000 | Freq: Every morning | TRANSDERMAL | 1 refills | Status: DC
  Filled 2023-01-11: qty 150, 40d supply, fill #0

## 2023-01-20 ENCOUNTER — Other Ambulatory Visit: Payer: Self-pay

## 2023-01-20 ENCOUNTER — Emergency Department (HOSPITAL_BASED_OUTPATIENT_CLINIC_OR_DEPARTMENT_OTHER): Payer: Commercial Managed Care - PPO

## 2023-01-20 ENCOUNTER — Emergency Department (HOSPITAL_BASED_OUTPATIENT_CLINIC_OR_DEPARTMENT_OTHER)
Admission: EM | Admit: 2023-01-20 | Discharge: 2023-01-20 | Disposition: A | Discharge status: Home / Self Care | Payer: Commercial Managed Care - PPO | Attending: Emergency Medicine | Admitting: Emergency Medicine

## 2023-01-20 ENCOUNTER — Encounter (HOSPITAL_BASED_OUTPATIENT_CLINIC_OR_DEPARTMENT_OTHER): Payer: Self-pay | Admitting: Emergency Medicine

## 2023-01-20 DIAGNOSIS — R202 Paresthesia of skin: Secondary | ICD-10-CM | POA: Diagnosis not present

## 2023-01-20 DIAGNOSIS — R200 Anesthesia of skin: Secondary | ICD-10-CM | POA: Diagnosis not present

## 2023-01-20 DIAGNOSIS — M5010 Cervical disc disorder with radiculopathy, unspecified cervical region: Secondary | ICD-10-CM | POA: Insufficient documentation

## 2023-01-20 DIAGNOSIS — I2510 Atherosclerotic heart disease of native coronary artery without angina pectoris: Secondary | ICD-10-CM | POA: Insufficient documentation

## 2023-01-20 DIAGNOSIS — M542 Cervicalgia: Secondary | ICD-10-CM | POA: Diagnosis not present

## 2023-01-20 DIAGNOSIS — M5412 Radiculopathy, cervical region: Secondary | ICD-10-CM | POA: Diagnosis not present

## 2023-01-20 DIAGNOSIS — M479 Spondylosis, unspecified: Secondary | ICD-10-CM

## 2023-01-20 DIAGNOSIS — I6523 Occlusion and stenosis of bilateral carotid arteries: Secondary | ICD-10-CM | POA: Diagnosis not present

## 2023-01-20 DIAGNOSIS — M79601 Pain in right arm: Secondary | ICD-10-CM | POA: Diagnosis not present

## 2023-01-20 LAB — CBC WITH DIFFERENTIAL/PLATELET
Abs Immature Granulocytes: 0.03 10*3/uL (ref 0.00–0.07)
Basophils Absolute: 0.1 10*3/uL (ref 0.0–0.1)
Basophils Relative: 1 %
Eosinophils Absolute: 0.7 10*3/uL — ABNORMAL HIGH (ref 0.0–0.5)
Eosinophils Relative: 9 %
HCT: 42.3 % (ref 39.0–52.0)
Hemoglobin: 14.1 g/dL (ref 13.0–17.0)
Immature Granulocytes: 0 %
Lymphocytes Relative: 29 %
Lymphs Abs: 2.4 10*3/uL (ref 0.7–4.0)
MCH: 31.6 pg (ref 26.0–34.0)
MCHC: 33.3 g/dL (ref 30.0–36.0)
MCV: 94.8 fL (ref 80.0–100.0)
Monocytes Absolute: 0.6 10*3/uL (ref 0.1–1.0)
Monocytes Relative: 7 %
Neutro Abs: 4.5 10*3/uL (ref 1.7–7.7)
Neutrophils Relative %: 54 %
Platelets: 260 10*3/uL (ref 150–400)
RBC: 4.46 MIL/uL (ref 4.22–5.81)
RDW: 12.9 % (ref 11.5–15.5)
WBC: 8.4 10*3/uL (ref 4.0–10.5)
nRBC: 0 % (ref 0.0–0.2)

## 2023-01-20 LAB — COMPREHENSIVE METABOLIC PANEL
ALT: 22 U/L (ref 0–44)
AST: 19 U/L (ref 15–41)
Albumin: 3.8 g/dL (ref 3.5–5.0)
Alkaline Phosphatase: 55 U/L (ref 38–126)
Anion gap: 8 (ref 5–15)
BUN: 11 mg/dL (ref 8–23)
CO2: 24 mmol/L (ref 22–32)
Calcium: 8.9 mg/dL (ref 8.9–10.3)
Chloride: 103 mmol/L (ref 98–111)
Creatinine, Ser: 0.86 mg/dL (ref 0.61–1.24)
GFR, Estimated: >60 mL/min (ref >60)
Glucose, Bld: 83 mg/dL (ref 70–99)
Potassium: 4 mmol/L (ref 3.5–5.1)
Sodium: 135 mmol/L (ref 135–145)
Total Bilirubin: 0.4 mg/dL (ref 0.3–1.2)
Total Protein: 6.6 g/dL (ref 6.5–8.1)

## 2023-01-20 LAB — TROPONIN I (HIGH SENSITIVITY)
Troponin I (High Sensitivity): 3 ng/L (ref <18)
Troponin I (High Sensitivity): 3 ng/L (ref <18)

## 2023-01-20 MED ORDER — LIDOCAINE 5 % EX PTCH
1.0000 | MEDICATED_PATCH | CUTANEOUS | Status: DC
  Administered 2023-01-20: 1 via TRANSDERMAL
  Filled 2023-01-20: qty 1

## 2023-01-20 MED ORDER — CYCLOBENZAPRINE HCL 5 MG PO TABS
5.0000 mg | ORAL_TABLET | Freq: Once | ORAL | Status: AC
  Administered 2023-01-20: 5 mg via ORAL
  Filled 2023-01-20: qty 1

## 2023-01-20 MED ORDER — HYDROCODONE-ACETAMINOPHEN 5-325 MG PO TABS
1.0000 | ORAL_TABLET | Freq: Once | ORAL | Status: AC
  Administered 2023-01-20: 1 via ORAL
  Filled 2023-01-20: qty 1

## 2023-01-20 MED ORDER — KETOROLAC TROMETHAMINE 15 MG/ML IJ SOLN
15.0000 mg | Freq: Once | INTRAMUSCULAR | Status: AC
  Administered 2023-01-20: 15 mg via INTRAVENOUS
  Filled 2023-01-20: qty 1

## 2023-01-20 MED ORDER — KETOROLAC TROMETHAMINE 30 MG/ML IJ SOLN: 30.0000 mg | Freq: Once | INTRAMUSCULAR | Status: DC

## 2023-01-20 MED ORDER — CYCLOBENZAPRINE HCL 5 MG PO TABS: 5.0000 mg | ORAL_TABLET | Freq: Three times a day (TID) | ORAL | 0 refills | Status: DC | PRN

## 2023-01-20 MED ORDER — KETOROLAC TROMETHAMINE 10 MG PO TABS: 10.0000 mg | ORAL_TABLET | Freq: Four times a day (QID) | ORAL | 0 refills | Status: DC | PRN

## 2023-01-20 MED ORDER — IOHEXOL 350 MG/ML SOLN
75.0000 mL | Freq: Once | INTRAVENOUS | Status: AC | PRN
  Administered 2023-01-20: 75 mL via INTRAVENOUS

## 2023-01-20 NOTE — ED Triage Notes (Signed)
Pt reports RT side neck pain that radiates down RUE at times; pain x 1 mo; difficulty turning head; no injury

## 2023-01-20 NOTE — ED Notes (Signed)
Pt off the floor to CT.

## 2023-01-20 NOTE — ED Notes (Signed)
Pt returned from radiology.

## 2023-01-20 NOTE — Discharge Instructions (Addendum)
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  You will need to follow-up as prescribed, and do not take any muscle relaxers while driving, or making big medical decisions.  You can take the prescribed anti-inflammatories, but do not take them with meloxicam, ibuprofen, or other NSAIDs.  Additionally you are found to have some disease of your left ICA, or vessels in your arteries in your neck, please follow-up with your primary care in regards to this.

## 2023-01-20 NOTE — ED Provider Notes (Signed)
[facility] Provider Note   CSN: [id number] Arrival date & time: 01/20/23  1056     History  Chief Complaint  Patient presents with   Neck Pain    Scott Stuart is a 62 y.o. male, history of carotid enterectomy, who presents to the ED secondary to right neck pain that is been going on for the past month, that is sharp and shooting, and spastic in nature radiating down his right arm.  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  He states the pain is severe and has been disabling.  Notes that he has a history of an enterectomy, which she did not follow-up on, to get annual scans of his carotids, and that he also has a high calcium score.  States that the pain is worse when laying down, now, and with movement of his neck.  Denies any chest pain, or shortness of breath, no nausea or vomiting.    Home Medications Prior to Admission medications   Medication Sig Start Date End Date Taking? Authorizing Provider  cyclobenzaprine (FLEXERIL) 5 MG tablet Take 1 tablet (5 mg total) by mouth 3 (three) times daily as needed for muscle spasms. 01/20/23  Yes Christianna Belmonte L, PA  ketorolac (TORADOL) 10 MG tablet Take 1 tablet (10 mg total) by mouth every 6 (six) hours as needed. 01/20/23  Yes Caidyn Blossom L, PA  ALPRAZolam (XANAX) 0.25 MG tablet Take 0.25 mg by mouth daily as needed for anxiety. 01/07/18   [provider]  atorvastatin (LIPITOR) 40 MG tablet Take 40 mg by mouth every evening. 11/25/17   [provider]  clindamycin (CLEOCIN T) 1 % external solution Apply 1 application externally once a day 30 days 06/14/22     famotidine (PEPCID) 40 MG tablet Take 1 tablet (40 mg total) by mouth 2 (two) times daily. 12/12/21     fluconazole (DIFLUCAN) 200 MG tablet Take 1 tablet by mouth once a week 01/30/22     ketoconazole (NIZORAL) 2 % cream Apply 1 application Externally Once a day 04/14/22      meclizine (ANTIVERT) 25 MG tablet Take 1 tablet (25 mg total) by mouth 3 (three) times daily as needed for dizziness. 02/04/18   Fatima Blank, MD  meloxicam (MOBIC) 15 MG tablet Take 1 tablet by mouth once daily 10/03/21     rosuvastatin (CRESTOR) 20 MG tablet Take by mouth. 10/06/20   [provider]  rosuvastatin (CRESTOR) 20 MG tablet TAKE 1 TABLET BY MOUTH ONCE DAILY 10/06/20 10/06/21  Melody Haver, PA-C  rosuvastatin (CRESTOR) 20 MG tablet Take 1 tablet (20 mg total) by mouth daily. 12/08/21     sildenafil (VIAGRA) 100 MG tablet Take  1 tablet by mouth once daily as needed 10/03/21     sildenafil (VIAGRA) 100 MG tablet Take 0.5 tablets (50 mg total) by mouth every other day. Take 1-2 hours before intercourse as "boost" dose in conjunction with daily tadalafil. 11/29/22     sucralfate (CARAFATE) 1 g tablet Crush 1 tablet in 1 ounce of liquid 4 times a day (between meals and at bedtime) for 30 day. 12/12/21     tadalafil (CIALIS) 5 MG tablet Take 1 tablet (5 mg total) by mouth daily. 10/04/22     tamsulosin (FLOMAX) 0.4 MG CAPS capsule Take 1 capsules by mouth once daily 10/03/21  Testosterone (ANDROGEL PUMP) 20.25 MG/ACT (1.62%) GEL Apply 2 pumps topically in the morning to clean/intact skin on shoulder, upper arms, or stomach. 11/29/22     Testosterone (ANDROGEL PUMP) 20.25 MG/ACT (1.62%) GEL Apply 3 Pump topically every morning to clean/intact skin on shoulder. upper arms, or stomach. 01/11/23         Allergies    Patient has no known allergies.    Review of Systems   Review of Systems  Cardiovascular:  Negative for chest pain.  Musculoskeletal:  Positive for neck pain.    Physical Exam Updated Vital Signs BP (!) 141/94   Pulse 62   Temp 98.3 F (36.8 C) (Oral)   Resp 15   Ht '6\' 1"'$  (1.854 m)   Wt 99.8 kg   SpO2 99%   BMI 29.03 kg/m  Physical Exam Vitals and nursing note reviewed.  Constitutional:      General: He is not in acute distress.     Appearance: He is well-developed.  HENT:     Head: Normocephalic and atraumatic.  Eyes:     Conjunctiva/sclera: Conjunctivae normal.  Neck:     Comments: No midline tenderness, tenderness to palpation of posterior lateral neck, radiating to the deltoid, shoulder, and then down the right arm.  He does have positive Tinel's test, pain worse with empty can test.  Pain worse with flexion extension, rotation of the neck.  Additionally worse with external rotation of the shoulder. Cardiovascular:     Rate and Rhythm: Normal rate and regular rhythm.     Heart sounds: No murmur heard. Pulmonary:     Effort: Pulmonary effort is normal. No respiratory distress.     Breath sounds: Normal breath sounds.  Abdominal:     Palpations: Abdomen is soft.     Tenderness: There is no abdominal tenderness.  Musculoskeletal:        General: No swelling.     Cervical back: Neck supple.  Skin:    General: Skin is warm and dry.     Capillary Refill: Capillary refill takes less than 2 seconds.  Neurological:     Mental Status: He is alert.  Psychiatric:        Mood and Affect: Mood normal.     ED Results / Procedures / Treatments   Labs (all labs ordered are listed, but only abnormal results are displayed) Labs Reviewed  CBC WITH DIFFERENTIAL/PLATELET - Abnormal; Notable for the following components:      Result Value   Eosinophils Absolute 0.7 (*)    All other components within normal limits  COMPREHENSIVE METABOLIC PANEL  TROPONIN I (HIGH SENSITIVITY)  TROPONIN I (HIGH SENSITIVITY)    EKG None  Radiology CT ANGIO HEAD NECK W WO CM  Result Date: 01/20/2023 CLINICAL DATA:  Numbness and tingling. Right-sided neck pain that extends into the right upper extremity over the last month. Difficulty with turning head. Status post right carotid endarterectomy. EXAM: CT ANGIOGRAPHY HEAD AND NECK TECHNIQUE: Multidetector CT imaging of the head and neck was performed using the standard protocol during bolus  administration of intravenous contrast. Multiplanar CT image reconstructions and MIPs were obtained to evaluate the vascular anatomy. Carotid stenosis measurements (when applicable) are obtained utilizing NASCET criteria, using the distal internal carotid diameter as the denominator. RADIATION DOSE REDUCTION: This exam was performed according to the departmental dose-optimization program which includes automated exposure control, adjustment of the mA and/or kV according to patient size and/or use of iterative reconstruction technique. CONTRAST:  39m  OMNIPAQUE IOHEXOL 350 MG/ML SOLN COMPARISON:  MR head without and with contrast 02/14/2019. CT head without contrast 02/04/2018 FINDINGS: CT HEAD FINDINGS Brain: No acute infarct, hemorrhage, or mass lesion is present. No significant white matter lesions are present. Deep brain nuclei are within normal limits. The ventricles are of normal size. No significant extraaxial fluid collection is present. The brainstem and cerebellum are within normal limits. Midline structures are within normal limits. Vascular: Atherosclerotic calcifications are present within the cavernous internal carotid arteries bilaterally. No asymmetric hyperdense vessel is present. Skull: Calvarium is intact. No focal lytic or blastic lesions are present. No significant extracranial soft tissue lesion is present. Sinuses/Orbits: Mild mucosal thickening is present within the anterior ethmoid air cells. The paranasal sinuses and mastoid air cells are otherwise clear. The globes and orbits are within normal limits. Review of the MIP images confirms the above findings CTA NECK FINDINGS Aortic arch: A 3 vessel arch configuration is present. No significant atherosclerotic disease or stenosis is present. Right carotid system: The right common carotid artery is within normal limits. Bifurcation is unremarkable. Cervical right ICA is normal. Left carotid system: Left common carotid artery is within normal  limits. Atherosclerotic calcifications are present at the proximal left ICA without a significant stenosis relative to the more distal vessel. Cervical left ICA is otherwise normal. Vertebral arteries: The vertebral arteries are codominant. Both vertebral arteries originate from the subclavian arteries without significant stenosis. No significant stenosis is present in either vertebral artery in the neck. Skeleton: Vertebral body heights and alignment are normal. Mild degenerative changes are present in the cervical spine, most pronounced at C5-6. No acute or focal osseous lesions are present. Other neck: Soft tissues the neck are otherwise unremarkable. Salivary glands are within normal limits. Thyroid is normal. No significant adenopathy is present. No focal mucosal or submucosal lesions are present. Upper chest: Mild dependent atelectasis is present. Lungs are otherwise clear. Review of the MIP images confirms the above findings CTA HEAD FINDINGS Anterior circulation: Atherosclerotic calcifications are present within the cavernous internal carotid arteries bilaterally. No significant stenosis is present through the ICA termini. The A1 and M1 segments are normal. Anterior communicating artery is patent. MCA bifurcations are within normal limits bilaterally. The ACA and MCA branch vessels are within normal limits bilaterally. Posterior circulation: Vertebrobasilar junction and basilar artery is normal. AICA vessels are dominant bilaterally. The right posterior cerebral artery originates from the basilar tip. Superior cerebellar origins are normal. The left posterior cerebral artery is of fetal type PCA branch vessels are within normal limits bilaterally. Venous sinuses: The dural sinuses are patent. The straight sinus and deep cerebral veins are intact. Cortical veins are within normal limits. No significant vascular malformation is evident. Anatomic variants: Fetal type left posterior cerebral artery. Review of  the MIP images confirms the above findings IMPRESSION: 1. Atherosclerotic calcifications at the proximal left ICA and cavernous internal carotid arteries bilaterally without significant stenosis relative to the more distal vessels. 2. Normal variant CTA Circle of Willis without significant proximal stenosis, aneurysm, or branch vessel occlusion. 3. Mild degenerative changes of the cervical spine, most pronounced at C5-6. Electronically Signed   By: San Morelle M.D.   On: 01/20/2023 15:55   DG Chest 2 View  Result Date: 01/20/2023 CLINICAL DATA:  Right arm numbness EXAM: CHEST - 2 VIEW COMPARISON:  12/10/2016 FINDINGS: The heart size and mediastinal contours are within normal limits. Elevation of the right hemidiaphragm. Both lungs are clear. The visualized skeletal structures are  unremarkable. IMPRESSION: No active cardiopulmonary disease. Electronically Signed   By: Davina Poke D.O.   On: 01/20/2023 15:47   DG Cervical Spine 2-3 Views  Result Date: 01/20/2023 CLINICAL DATA:  Neck pain. EXAM: CERVICAL SPINE - 2-3 VIEW COMPARISON:  None Available. FINDINGS: There is no evidence of cervical spine fracture or prevertebral soft tissue swelling. Alignment is normal. Moderate degenerative disc disease is noted at C5-6 with anterior osteophyte formation. IMPRESSION: Moderate degenerative disc disease C5-6.  No acute abnormality seen. Electronically Signed   By: Marijo Conception M.D.   On: 01/20/2023 12:04    Procedures Procedures    Medications Ordered in ED Medications  lidocaine (LIDODERM) 5 % 1 patch (1 patch Transdermal Patch Applied 01/20/23 1338)  HYDROcodone-acetaminophen (NORCO/VICODIN) 5-325 MG per tablet 1 tablet (1 tablet Oral Given 01/20/23 1338)  cyclobenzaprine (FLEXERIL) tablet 5 mg (5 mg Oral Given 01/20/23 1338)  iohexol (OMNIPAQUE) 350 MG/ML injection 75 mL (75 mLs Intravenous Contrast Given 01/20/23 1512)  ketorolac (TORADOL) 15 MG/ML injection 15 mg (15 mg Intravenous Given  01/20/23 1726)    ED Course/ Medical Decision Making/ A&P                             Medical Decision Making Patient is a 62 year old male, history of coronary artery disease, carotid enterectomy, who presents to the ED secondary to right neck pain, going down the arm, we will obtain troponins, CTA to rule out a dissection of the neck, and give pain medication to help with symptoms.  Amount and/or Complexity of Data Reviewed Labs: ordered.    Details: Troponins within normal limits Radiology: ordered.    Details: CTA shows left ICA disease, no findings for right coronary artery dissection Discussion of management or test interpretation with external provider(s): Discussed with patient, does have C5-C6 degeneration, possibly causing impinged nerve, pain improved with Toradol.  We discussed pain control at home and follow-up with orthopedics and he was in agreement with plan.  Discussed negative CTA, normal labs. HEART of 4. Return precautions emphasized.  Risk Prescription drug management.   Final Clinical Impression(s) / ED Diagnoses Final diagnoses:  Cervical radiculopathy  Degeneration of spine    Rx / DC Orders ED Discharge Orders          Ordered    cyclobenzaprine (FLEXERIL) 5 MG tablet  3 times daily PRN        01/20/23 1759    ketorolac (TORADOL) 10 MG tablet  Every 6 hours PRN        01/20/23 1759              Ramonte Mena, Si Gaul, PA 01/20/23 1915    Elgie Congo, MD 01/21/23 [phone]

## 2023-01-21 ENCOUNTER — Other Ambulatory Visit (HOSPITAL_BASED_OUTPATIENT_CLINIC_OR_DEPARTMENT_OTHER): Payer: Self-pay

## 2023-01-21 DIAGNOSIS — M542 Cervicalgia: Secondary | ICD-10-CM | POA: Diagnosis not present

## 2023-01-21 MED ORDER — CELECOXIB 200 MG PO CAPS
200.0000 mg | ORAL_CAPSULE | Freq: Every day | ORAL | 1 refills | Status: DC | PRN
  Filled 2023-01-21: qty 30, 30d supply, fill #0

## 2023-01-21 MED ORDER — PREDNISONE 5 MG PO TABS
ORAL_TABLET | ORAL | 1 refills | Status: DC
  Filled 2023-01-21: qty 21, 6d supply, fill #0
  Filled 2023-01-31: qty 21, 6d supply, fill #1

## 2023-01-24 DIAGNOSIS — E785 Hyperlipidemia, unspecified: Secondary | ICD-10-CM | POA: Diagnosis not present

## 2023-01-24 DIAGNOSIS — Z6830 Body mass index (BMI) 30.0-30.9, adult: Secondary | ICD-10-CM | POA: Diagnosis not present

## 2023-01-24 DIAGNOSIS — G459 Transient cerebral ischemic attack, unspecified: Secondary | ICD-10-CM | POA: Diagnosis not present

## 2023-01-24 DIAGNOSIS — I10 Essential (primary) hypertension: Secondary | ICD-10-CM | POA: Diagnosis not present

## 2023-01-24 DIAGNOSIS — M542 Cervicalgia: Secondary | ICD-10-CM | POA: Diagnosis not present

## 2023-01-24 DIAGNOSIS — I779 Disorder of arteries and arterioles, unspecified: Secondary | ICD-10-CM | POA: Diagnosis not present

## 2023-01-25 ENCOUNTER — Other Ambulatory Visit (HOSPITAL_BASED_OUTPATIENT_CLINIC_OR_DEPARTMENT_OTHER): Payer: Self-pay

## 2023-01-25 MED ORDER — ROSUVASTATIN CALCIUM 20 MG PO TABS
20.0000 mg | ORAL_TABLET | Freq: Every day | ORAL | 3 refills | Status: DC
  Filled 2023-01-25: qty 90, 90d supply, fill #0
  Filled 2023-04-23: qty 90, 90d supply, fill #1
  Filled 2023-09-01: qty 90, 90d supply, fill #2

## 2023-01-31 ENCOUNTER — Other Ambulatory Visit (HOSPITAL_BASED_OUTPATIENT_CLINIC_OR_DEPARTMENT_OTHER): Payer: Self-pay

## 2023-01-31 DIAGNOSIS — M542 Cervicalgia: Secondary | ICD-10-CM | POA: Diagnosis not present

## 2023-01-31 DIAGNOSIS — H04123 Dry eye syndrome of bilateral lacrimal glands: Secondary | ICD-10-CM | POA: Diagnosis not present

## 2023-01-31 DIAGNOSIS — H52203 Unspecified astigmatism, bilateral: Secondary | ICD-10-CM | POA: Diagnosis not present

## 2023-01-31 MED ORDER — GABAPENTIN 300 MG PO CAPS
300.0000 mg | ORAL_CAPSULE | Freq: Three times a day (TID) | ORAL | 0 refills | Status: DC | PRN
  Filled 2023-01-31: qty 60, 20d supply, fill #0

## 2023-02-04 DIAGNOSIS — M542 Cervicalgia: Secondary | ICD-10-CM | POA: Diagnosis not present

## 2023-02-06 DIAGNOSIS — M542 Cervicalgia: Secondary | ICD-10-CM | POA: Diagnosis not present

## 2023-02-07 DIAGNOSIS — M542 Cervicalgia: Secondary | ICD-10-CM | POA: Diagnosis not present

## 2023-02-09 DIAGNOSIS — M5412 Radiculopathy, cervical region: Secondary | ICD-10-CM | POA: Diagnosis not present

## 2023-02-12 DIAGNOSIS — M542 Cervicalgia: Secondary | ICD-10-CM | POA: Diagnosis not present

## 2023-02-13 ENCOUNTER — Other Ambulatory Visit: Payer: Self-pay | Admitting: *Deleted

## 2023-02-13 DIAGNOSIS — I779 Disorder of arteries and arterioles, unspecified: Secondary | ICD-10-CM

## 2023-02-14 ENCOUNTER — Ambulatory Visit: Payer: Commercial Managed Care - PPO | Admitting: Vascular Surgery

## 2023-02-14 ENCOUNTER — Ambulatory Visit (HOSPITAL_COMMUNITY)
Admission: RE | Admit: 2023-02-14 | Discharge: 2023-02-14 | Disposition: A | Discharge status: Home / Self Care | Payer: Commercial Managed Care - PPO | Source: Ambulatory Visit | Attending: Vascular Surgery | Admitting: Vascular Surgery

## 2023-02-14 ENCOUNTER — Encounter: Payer: Self-pay | Admitting: Vascular Surgery

## 2023-02-14 VITALS — BP 144/95 | HR 83 | Temp 98.9°F | Resp 20 | Ht 73.0 in | Wt 225.0 lb

## 2023-02-14 DIAGNOSIS — I6522 Occlusion and stenosis of left carotid artery: Secondary | ICD-10-CM | POA: Diagnosis not present

## 2023-02-14 DIAGNOSIS — I779 Disorder of arteries and arterioles, unspecified: Secondary | ICD-10-CM | POA: Diagnosis not present

## 2023-02-14 NOTE — Progress Notes (Signed)
ASSESSMENT & PLAN   BILATERAL CAROTID DISEASE: This patient underwent a right carotid endarterectomy in [place] in 2018.  The patient had a CT scan of the neck which showed some plaque in the left internal carotid artery.  By duplex scan today the carotid endarterectomy site on the right is widely patent.  He will simply need yearly follow-up duplex scans.  By duplex scan today the plaque in the left proximal ICA is not causing any significant stenosis.  He is on a statin.  I have encouraged him to begin taking 81 mg of enteric-coated aspirin daily.  He is not a smoker.  We discussed importance of exercise and nutrition.  He works at the [employer] and therefore we will try to arrange for him to have his duplex done at the [employer] in 1 year.  He has seen Dr. Johnsie Cancel and plans on getting an appointment to see him yearly also.  We will try to coordinate that so that Dr. Johnsie Cancel can follow his carotid disease also.  REASON FOR CONSULT:    Carotid disease.  The consult is requested by Marda Stalker, PA.  HPI:   Scott Stuart is a 62 y.o. male who was referred with a carotid disease.  I have reviewed the records from the referring office.  The patient was seen on 01/24/2023.  He been having some right neck pain and tightness for about a month.  He reportedly had a carotid endarterectomy in 2018.  On my history, the patient had a right carotid endarterectomy in [place] in 2018.  He has not had a follow-up duplex scan in several years according to the patient.  He had been having some neck spasms on the right and is found to have some cervical disc disease with compression of the cervical disc at the C4 level.  A CT angiogram of the neck was obtained which showed some plaque in the left proximal ICA.  He was sent for vascular consultation.  He states that he had a TIA associated with visual disturbance back in 2018 prior to his right carotid endarterectomy.  He denies  any other history of stroke, TIAs, expressive or receptive aphasia, or amaurosis fugax.  He is on a statin.  He does not take aspirin.  Past Medical History:  Diagnosis Date   Bipolar 1 disorder ([facility])    CAD (coronary artery disease)    status carotid endarterectomy   Colon polyps    Depression    ED (erectile dysfunction)    Fatty liver    Frequent headaches    GERD (gastroesophageal reflux disease)    Hyperlipidemia    Hypertension    Hypertriglyceridemia    Low testosterone    Mass of finger of left hand    Otitis media    Right-sided carotid artery disease ([facility]) 05/31/2017   Strep throat    TIA (transient ischemic attack)     Family History  Problem Relation Age of Onset   Stroke Father    Hyperlipidemia Father    Hypertension Father    Anesthesia problems Mother    Cancer Mother    Cataracts Mother    Depression Mother    Other Mother        musclar degeneration   Thyroid disease Mother    Thyroid disease Sister    Heart disease Paternal Grandfather     SOCIAL HISTORY: Social History   Tobacco Use   Smoking status: Former  Types: Cigarettes    Quit date: 05/27/2019    Years since quitting: 3.7   Smokeless tobacco: Never  Substance Use Topics   Alcohol use: No    Comment: No longer drinks alcohol     Allergies  Allergen Reactions   Aripiprazole     Other Reaction(s): chest pain, hypertension, slurred speech    Current Outpatient Medications  Medication Sig Dispense Refill   ALPRAZolam (XANAX) 0.25 MG tablet Take 0.25 mg by mouth daily as needed for anxiety.  0   famotidine (PEPCID) 40 MG tablet Take 1 tablet (40 mg total) by mouth 2 (two) times daily. 180 tablet 4   gabapentin (NEURONTIN) 300 MG capsule Take 1 capsule (300 mg total) by mouth 3 (three) times daily as needed for 20 days. 60 capsule 0   ketoconazole (NIZORAL) 2 % cream Apply 1 application Externally Once a day 90 g 0   rosuvastatin (CRESTOR) 20 MG tablet Take 1 tablet (20 mg total)  by mouth daily. 90 tablet 3   No current facility-administered medications for this visit.    REVIEW OF SYSTEMS:  [X]$  denotes positive finding, [ ]$  denotes negative finding Cardiac  Comments:  Chest pain or chest pressure:    Shortness of breath upon exertion:    Short of breath when lying flat:    Irregular heart rhythm:        Vascular    Pain in calf, thigh, or hip brought on by ambulation:    Pain in feet at night that wakes you up from your sleep:     Blood clot in your veins:    Leg swelling:         Pulmonary    Oxygen at home:    Productive cough:     Wheezing:         Neurologic    Sudden weakness in arms or legs:     Sudden numbness in arms or legs:     Sudden onset of difficulty speaking or slurred speech:    Temporary loss of vision in one eye:     Problems with dizziness:         Gastrointestinal    Blood in stool:     Vomited blood:         Genitourinary    Burning when urinating:     Blood in urine:        Psychiatric    Major depression:         Hematologic    Bleeding problems:    Problems with blood clotting too easily:        Skin    Rashes or ulcers:        Constitutional    Fever or chills:    -  PHYSICAL EXAM:   Vitals:   02/14/23 0825 02/14/23 0827  BP: (!) 135/94 (!) 144/95  Pulse: 83   Resp: 20   Temp: 98.9 F (37.2 C)   SpO2: 95%   Weight: 225 lb (102.1 kg)   Height: 6' 1"$  (1.854 m)    Body mass index is 29.69 kg/m. GENERAL: The patient is a well-nourished male, in no acute distress. The vital signs are documented above. CARDIAC: There is a regular rate and rhythm.  VASCULAR: I do not detect carotid bruits. He has palpable pedal pulses. PULMONARY: There is good air exchange bilaterally without wheezing or rales. ABDOMEN: Soft and non-tender with normal pitched bowel sounds.  MUSCULOSKELETAL: There are no major deformities. NEUROLOGIC:  No focal weakness or paresthesias are detected. SKIN: There are no ulcers or  rashes noted. PSYCHIATRIC: The patient has a normal affect.  DATA:    CT ANGIO HEAD/NECK: I reviewed the images of the CT angiogram of the neck.  There is some calcific plaque at the proximal left ICA and cavernous internal carotid arteries bilaterally.  CAROTID DUPLEX: I have independently interpreted his carotid duplex scan today.  On the right side there is no evidence of recurrent stenosis where he has had previous right carotid endarterectomy.  The right vertebral artery is patent with antegrade flow.  On the left side there is a less than 39% stenosis in the proximal ICA.  The left vertebral artery is patent with antegrade flow.    Deitra Mayo [facility]

## 2023-02-19 DIAGNOSIS — M5412 Radiculopathy, cervical region: Secondary | ICD-10-CM | POA: Diagnosis not present

## 2023-02-19 DIAGNOSIS — M542 Cervicalgia: Secondary | ICD-10-CM | POA: Diagnosis not present

## 2023-02-21 DIAGNOSIS — M542 Cervicalgia: Secondary | ICD-10-CM | POA: Diagnosis not present

## 2023-02-26 DIAGNOSIS — M5412 Radiculopathy, cervical region: Secondary | ICD-10-CM | POA: Diagnosis not present

## 2023-03-06 DIAGNOSIS — M5412 Radiculopathy, cervical region: Secondary | ICD-10-CM | POA: Diagnosis not present

## 2023-03-07 ENCOUNTER — Telehealth: Payer: Self-pay | Admitting: *Deleted

## 2023-03-07 DIAGNOSIS — L821 Other seborrheic keratosis: Secondary | ICD-10-CM | POA: Diagnosis not present

## 2023-03-07 DIAGNOSIS — M542 Cervicalgia: Secondary | ICD-10-CM | POA: Diagnosis not present

## 2023-03-07 DIAGNOSIS — L578 Other skin changes due to chronic exposure to nonionizing radiation: Secondary | ICD-10-CM | POA: Diagnosis not present

## 2023-03-07 DIAGNOSIS — D235 Other benign neoplasm of skin of trunk: Secondary | ICD-10-CM | POA: Diagnosis not present

## 2023-03-07 DIAGNOSIS — D225 Melanocytic nevi of trunk: Secondary | ICD-10-CM | POA: Diagnosis not present

## 2023-03-07 DIAGNOSIS — Z01818 Encounter for other preprocedural examination: Secondary | ICD-10-CM | POA: Diagnosis not present

## 2023-03-07 DIAGNOSIS — M4802 Spinal stenosis, cervical region: Secondary | ICD-10-CM | POA: Diagnosis not present

## 2023-03-07 DIAGNOSIS — L814 Other melanin hyperpigmentation: Secondary | ICD-10-CM | POA: Diagnosis not present

## 2023-03-07 NOTE — Telephone Encounter (Signed)
   Pre-operative Risk Assessment    Patient Name: Scott Stuart  DOB: 1961/06/08 MRN: [id number]      Request for Surgical Clearance    Procedure:   TOTAL DISC REPLACEMENT  Date of Surgery:  Clearance 03/22/23                                 Surgeon:  DR. [facility] Surgeon's Group or Practice Name:  [facility] Phone number:  [phone] ATTN: Orson Slick Fax number:  [phone]   Type of Clearance Requested:   - Medical ; NO MEDICATIONS LISTED AS NEEDING  TO BE HELD   Type of Anesthesia:  General    Additional requests/questions:    Jiles Prows   03/07/2023, 11:59 AM

## 2023-03-07 NOTE — Telephone Encounter (Signed)
   Name: Scott Stuart  DOB: [birth year]  MRN: [id number]  Primary Cardiologist: None  Chart reviewed as part of pre-operative protocol coverage. The patient has an upcoming visit scheduled with Dr. Irish Lack on 03/08/2023 at which time clearance can be addressed in case there are any issues that would impact surgical recommendations.  Total disc replacement is not scheduled until 03/22/2023 as below. I added preop FYI to appointment note so that provider is aware to address at time of outpatient visit.  Per office protocol the cardiology provider should forward their finalized clearance decision and recommendations regarding antiplatelet therapy to the requesting party below.     I will route this message as FYI to requesting party and remove this message from the preop box as separate preop APP input not needed at this time.   Please call with any questions.  Lenna Sciara, NP  03/07/2023, 12:20 PM

## 2023-03-07 NOTE — Progress Notes (Signed)
Cardiology Office Note   Date:  03/08/2023   ID:  Scott Stuart, DOB December 04, 1961, MRN [id number]  PCP:  Marda Stalker, PA-C    No chief complaint on file.  Preoperative cardiovascular exam  Wt Readings from Last 3 Encounters:  03/08/23 223 lb (101.2 kg)  02/14/23 225 lb (102.1 kg)  01/20/23 220 lb (99.8 kg)       History of Present Illness: Scott Stuart is a 62 y.o. male Dr. Florence Canner in 2020.  Records showed: " hx of TIA and right CEA  who is being seen today for the evaluation of CAD at the request of Dr Rex Kras..CRF;s include vascular disease , family history and HTN.  He is a Marine scientist in our office I read his calcium score done 05/11/19 which was 8 or 75 th percentile for age and sex Most of the calcium was in the LAD. He subsequently had a lexiscan myovue ( no ETT due to [place] restrictions)  On 05/19/19 which was normal EF 54% He has significant anxiety No chest pain Fairly sedentary except for walking his [place]."   2020: "The left ventricular ejection fraction is normal (55-65%). Nuclear stress EF: 54%. There was no ST segment deviation noted during stress. The study is normal. This is a low risk study.   Normal pharmacologic nuclear stress test with no evidence for a prior infarct or ischemia. LVEF 54% but appears better. "  Needs C5-C6 disc replacement.   Still walks the neighborhood including some hills.  No chest pain.  Can continue without stopping.    Denies : Chest pain. Dizziness. Leg edema. Nitroglycerin use. Orthopnea. Palpitations. Paroxysmal nocturnal dyspnea. Shortness of breath. Syncope.    Past Medical History:  Diagnosis Date   Bipolar 1 disorder ([place])    CAD (coronary artery disease)    status carotid endarterectomy   Colon polyps    Depression    ED (erectile dysfunction)    Fatty liver    Frequent headaches    GERD (gastroesophageal reflux disease)    Hyperlipidemia    Hypertension    Hypertriglyceridemia    Low  testosterone    Mass of finger of left hand    Otitis media    Right-sided carotid artery disease (HCC) 05/31/2017   Strep throat    TIA (transient ischemic attack)     Past Surgical History:  Procedure Laterality Date   APPENDECTOMY     CAROTID ENDARTERECTOMY Right 07/26/2017   neck   colonscopy     FINGER GANGLION CYST EXCISION Left 11/13/2016   HERNIA REPAIR     LAMINECTOMY     LASIK Bilateral 05/21/2002   STEROID INJECTION TO SCAR Right 11/13/2016   hip   TONSILLECTOMY     TRIGGER FINGER RELEASE Left 01/14/2016     Current Outpatient Medications  Medication Sig Dispense Refill   ALPRAZolam (XANAX) 0.25 MG tablet Take 0.25 mg by mouth daily as needed for anxiety.  0   aspirin EC 81 MG tablet Take 81 mg by mouth once. On Hold     cyclobenzaprine (FLEXERIL) 5 MG tablet Take 5 mg by mouth 3 (three) times daily as needed.     famotidine (PEPCID) 20 MG tablet Take 20 mg by mouth as needed for heartburn or indigestion.     ketoconazole (NIZORAL) 2 % cream Apply 1 application Externally Once a day (Patient taking differently: Apply 1 Application topically daily as needed for irritation.) 90 g 0   rosuvastatin (CRESTOR)  20 MG tablet Take 1 tablet (20 mg total) by mouth daily. 90 tablet 3   gabapentin (NEURONTIN) 300 MG capsule Take 1 capsule (300 mg total) by mouth 3 (three) times daily as needed for 20 days. (Patient not taking: Reported on 03/08/2023) 60 capsule 0   No current facility-administered medications for this visit.    Allergies:   Aripiprazole    Social History:  The patient  reports that he quit smoking about 3 years ago. His smoking use included cigarettes. He has never used smokeless tobacco. He reports that he does not drink alcohol and does not use drugs.   Family History:  The patient's family history includes Anesthesia problems in his mother; Cancer in his mother; Cataracts in his mother; Depression in his mother; Heart disease in his paternal grandfather;  Hyperlipidemia in his father; Hypertension in his father; Other in his mother; Stroke in his father; Thyroid disease in his mother and sister.    ROS:  Please see the history of present illness.   Otherwise, review of systems are positive for neck pain.   All other systems are reviewed and negative.    PHYSICAL EXAM: VS:  BP 130/80   Pulse 88   Ht 6\' 1"  (1.854 m)   Wt 223 lb (101.2 kg)   SpO2 97%   BMI 29.42 kg/m  , BMI Body mass index is 29.42 kg/m. GEN: Well nourished, well developed, in no acute distress HEENT: normal Neck: no JVD, carotid bruits, or masses Cardiac: RRR; no murmurs, rubs, or gallops,no edema  Respiratory:  clear to auscultation bilaterally, normal work of breathing GI: soft, nontender, nondistended, + BS MS: no deformity or atrophy Skin: warm and dry, no rash Neuro:  Strength and sensation are intact Psych: euthymic mood, full affect   EKG:   The ekg ordered Jan 2024 demonstrates NSR, no ST changes   Recent Labs: 01/20/2023: ALT 22; BUN 11; Creatinine, Ser 0.86; Hemoglobin 14.1; Platelets 260; Potassium 4.0; Sodium 135   Lipid Panel No results found for: "CHOL", "TRIG", "HDL", "CHOLHDL", "VLDL", "LDLCALC", "LDLDIRECT"   Other studies Reviewed: Additional studies/ records that were reviewed today with results demonstrating: prior stress test and labs reviewed.   ASSESSMENT AND PLAN:  Coronary artery calcification: Continue preventive therapy.  Aspirin 81 mg daily and continue statin therapy along with 30 minutes a day, 5 days a week of some kind of exercise. Preoperative cardiovascular exam: No further cardiovascular testing needed prior to surgery.  OK to hold aspirin 7 days prior to surgery. HTN: controlled with decreased salt intake.   Hyperlipidemia: Whole food, Plant-based diet. High fiber diet.  Avoid Processed food.  LDL 96 in 7/23.  Continue rosuvastatin.  PAD: s/p right CEA.  COntinue routine carotid Dopplers.    Current medicines are  reviewed at length with the patient today.  The patient concerns regarding his medicines were addressed.  The following changes have been made:  No change  Labs/ tests ordered today include:  No orders of the defined types were placed in this encounter.   Recommend 150 minutes/week of aerobic exercise Low fat, low carb, high fiber diet recommended  Disposition:   FU as needed   Signed, Larae Grooms, MD  03/08/2023 1:48 PM    [facility] [address], [place], [place]  [address] Phone: [phone]; Fax: [phone]

## 2023-03-08 ENCOUNTER — Encounter: Payer: Self-pay | Admitting: Interventional Cardiology

## 2023-03-08 ENCOUNTER — Ambulatory Visit: Payer: Commercial Managed Care - PPO | Attending: Interventional Cardiology | Admitting: Interventional Cardiology

## 2023-03-08 VITALS — BP 130/80 | HR 88 | Ht 73.0 in | Wt 223.0 lb

## 2023-03-08 DIAGNOSIS — I2584 Coronary atherosclerosis due to calcified coronary lesion: Secondary | ICD-10-CM

## 2023-03-08 DIAGNOSIS — Z01810 Encounter for preprocedural cardiovascular examination: Secondary | ICD-10-CM | POA: Diagnosis not present

## 2023-03-08 DIAGNOSIS — I2510 Atherosclerotic heart disease of native coronary artery without angina pectoris: Secondary | ICD-10-CM

## 2023-03-08 DIAGNOSIS — I779 Disorder of arteries and arterioles, unspecified: Secondary | ICD-10-CM

## 2023-03-08 DIAGNOSIS — E782 Mixed hyperlipidemia: Secondary | ICD-10-CM

## 2023-03-08 NOTE — Patient Instructions (Signed)
Medication Instructions:  Your physician recommends that you continue on your current medications as directed. Please refer to the Current Medication list given to you today.  *If you need a refill on your cardiac medications before your next appointment, please call your pharmacy*   Lab Work: none If you have labs (blood work) drawn today and your tests are completely normal, you will receive your results only by: MyChart Message (if you have MyChart) OR A paper copy in the mail If you have any lab test that is abnormal or we need to change your treatment, we will call you to review the results.   Testing/Procedures: none   Follow-Up: At [facility], you and your health needs are our priority.  As part of our continuing mission to provide you with exceptional heart care, we have created designated Provider Care Teams.  These Care Teams include your primary Cardiologist (physician) and Advanced Practice Providers (APPs -  Physician Assistants and Nurse Practitioners) who all work together to provide you with the care you need, when you need it.  We recommend signing up for the patient portal called "MyChart".  Sign up information is provided on this After Visit Summary.  MyChart is used to connect with patients for Virtual Visits (Telemedicine).  Patients are able to view lab/test results, encounter notes, upcoming appointments, etc.  Non-urgent messages can be sent to your provider as well.   To learn more about what you can do with MyChart, go to [email or web address].    Your next appointment:   As needed  Provider:   Jayadeep Varanasi, MD     Other Instructions    

## 2023-03-11 ENCOUNTER — Ambulatory Visit (HOSPITAL_COMMUNITY): Payer: Self-pay | Admitting: Orthopedic Surgery

## 2023-03-12 NOTE — Telephone Encounter (Signed)
Left message for the pt that he has been cleared and he will need to hold ASA x 7 days prior per Dr. Irish Lack. I will fax notes to surgeon office as well.

## 2023-03-13 NOTE — Pre-Procedure Instructions (Signed)
Surgical Instructions    Your procedure is scheduled on April, 1, 2024.  Report to [facility] Main Entrance "A" at 11:20 A.M., then check in with the Admitting office.  Call this number if you have problems the morning of surgery:  [phone]   If you have any questions prior to your surgery date call [phone]: Open Monday-Friday 8am-4pm If you experience any cold or flu symptoms such as cough, fever, chills, shortness of breath, etc. between now and your scheduled surgery, please notify us at the above number     Remember:  Do not eat after midnight the night before your surgery  You may drink clear liquids until 10:20AM the morning of your surgery.   Clear liquids allowed are: Water, Non-Citrus Juices (without pulp), Carbonated Beverages, Clear Tea, Black Coffee ONLY (NO MILK, CREAM OR POWDERED CREAMER of any kind), and Gatorade    Take these medicines the morning of surgery with A SIP OF WATER:  rosuvastatin (CRESTOR)   If Needed: acetaminophen (TYLENOL)  famotidine (PEPCID)   Follow your surgeon's instructions on when to stop Aspirin.  If no instructions were given by your surgeon then you will need to call the office to get those instructions.    As of today, STOP taking any Aleve, Naproxen, Ibuprofen, Motrin, Advil, Goody's, BC's, all herbal medications, fish oil, and all vitamins.             [facility] is not responsible for any belongings or valuables.    Do NOT Smoke (Tobacco/Vaping)  24 hours prior to your procedure  If you use a CPAP at night, you may bring your mask for your overnight stay.   Contacts, glasses, hearing aids, dentures or partials may not be worn into surgery, please bring cases for these belongings   For patients admitted to the hospital, discharge time will be determined by your treatment team.   Patients discharged the day of surgery will not be allowed to drive home, and someone needs to stay with them for 24 hours.   SURGICAL  WAITING ROOM VISITATION Patients having surgery or a procedure may have no more than 2 support people in the waiting area - these visitors may rotate.   Children under the age of 62 must have an adult with them who is not the patient. If the patient needs to stay at the hospital during part of their recovery, the visitor guidelines for inpatient rooms apply. Pre-op nurse will coordinate an appropriate time for 1 support person to accompany patient in pre-op.  This support person may not rotate.   Please refer to [email or web address] for the visitor guidelines for Inpatients (after your surgery is over and you are in a regular room).    Special instructions:    Oral Hygiene is also important to reduce your risk of infection.  Remember - BRUSH YOUR TEETH THE MORNING OF SURGERY WITH YOUR REGULAR TOOTHPASTE   [facility]- Preparing For Surgery  Before surgery, you can play an important role. Because skin is not sterile, your skin needs to be as free of germs as possible. You can reduce the number of germs on your skin by washing with CHG (chlorahexidine gluconate) Soap before surgery.  CHG is an antiseptic cleaner which kills germs and bonds with the skin to continue killing germs even after washing.     Please do not use if you have an allergy to CHG or antibacterial soaps. If your skin becomes reddened/irritated stop using the CHG.  Do  not shave (including legs and underarms) for at least 48 hours prior to first CHG shower. It is OK to shave your face.  Please follow these instructions carefully.     Shower the NIGHT BEFORE SURGERY and the MORNING OF SURGERY with CHG Soap.   If you chose to wash your hair, wash your hair first as usual with your normal shampoo. After you shampoo, rinse your hair and body thoroughly to remove the shampoo.  Then [employer] and genitals (private parts) with your normal soap and rinse thoroughly to remove  soap.  After that Use CHG Soap as you would any other liquid soap. You can apply CHG directly to the skin and wash gently with a scrungie or a clean washcloth.   Apply the CHG Soap to your body ONLY FROM THE NECK DOWN.  Do not use on open wounds or open sores. Avoid contact with your eyes, ears, mouth and genitals (private parts). Wash Face and genitals (private parts)  with your normal soap.   Wash thoroughly, paying special attention to the area where your surgery will be performed.  Thoroughly rinse your body with warm water from the neck down.  DO NOT shower/wash with your normal soap after using and rinsing off the CHG Soap.  Pat yourself dry with a CLEAN TOWEL.  Wear CLEAN PAJAMAS to bed the night before surgery  Place CLEAN SHEETS on your bed the night before your surgery  DO NOT SLEEP WITH PETS.   Day of Surgery:  Take a shower with CHG soap. Wear Clean/Comfortable clothing the morning of surgery Do not wear jewelry or makeup. Do not wear lotions, powders, perfumes/cologne or deodorant. Do not shave 48 hours prior to surgery.  Men may shave face and neck. Do not bring valuables to the hospital. Do not wear nail polish, gel polish, artificial nails, or any other type of covering on natural nails (fingers and toes) If you have artificial nails or gel coating that need to be removed by a nail salon, please have this removed prior to surgery. Artificial nails or gel coating may interfere with anesthesia's ability to adequately monitor your vital signs. Remember to brush your teeth WITH YOUR REGULAR TOOTHPASTE.    If you received a COVID test during your pre-op visit, it is requested that you wear a mask when out in public, stay away from anyone that may not be feeling well, and notify your surgeon if you develop symptoms. If you have been in contact with anyone that has tested positive in the last 10 days, please notify your surgeon.    Please read over the following fact  sheets that you were given.

## 2023-03-14 ENCOUNTER — Other Ambulatory Visit: Payer: Self-pay

## 2023-03-14 ENCOUNTER — Encounter (HOSPITAL_COMMUNITY): Payer: Self-pay

## 2023-03-14 ENCOUNTER — Encounter (HOSPITAL_COMMUNITY)
Admission: RE | Admit: 2023-03-14 | Discharge: 2023-03-14 | Disposition: A | Discharge status: Home / Self Care | Payer: Commercial Managed Care - PPO | Source: Ambulatory Visit | Attending: Orthopedic Surgery | Admitting: Orthopedic Surgery

## 2023-03-14 VITALS — BP 146/89 | HR 69 | Temp 98.5°F | Resp 18 | Ht 73.0 in | Wt 225.6 lb

## 2023-03-14 DIAGNOSIS — E785 Hyperlipidemia, unspecified: Secondary | ICD-10-CM | POA: Diagnosis not present

## 2023-03-14 DIAGNOSIS — I10 Essential (primary) hypertension: Secondary | ICD-10-CM | POA: Insufficient documentation

## 2023-03-14 DIAGNOSIS — Z01818 Encounter for other preprocedural examination: Secondary | ICD-10-CM

## 2023-03-14 DIAGNOSIS — Z01812 Encounter for preprocedural laboratory examination: Secondary | ICD-10-CM | POA: Diagnosis present

## 2023-03-14 HISTORY — DX: Other psychoactive substance abuse, uncomplicated: F19.10

## 2023-03-14 HISTORY — DX: Peripheral vascular disease, unspecified: I73.9

## 2023-03-14 HISTORY — DX: Family history of other specified conditions: Z84.89

## 2023-03-14 HISTORY — DX: Anxiety disorder, unspecified: F41.9

## 2023-03-14 HISTORY — DX: Pneumonia, unspecified organism: J18.9

## 2023-03-14 LAB — SURGICAL PCR SCREEN
MRSA, PCR: NEGATIVE
Staphylococcus aureus: NEGATIVE

## 2023-03-14 NOTE — Progress Notes (Signed)
PCP -  Tempie Donning, PA-C Cardiologist - Jenkins Rouge, MD/ Arnell Asal, MD   PPM/ICD - Denies  Chest x-ray - 01/20/2023 EKG - 01/20/2023 Stress Test - 05/19/2019 ECHO - 05/25/2017 Cardiac Cath - Denies  Sleep Study - Denies   DM: Denies   Blood Thinner Instructions:N/A Aspirin Instructions: Patient instructed by surgeon to hold Aspirin for 7 days. Patient states his last dose of aspirin was on 02/28/2023.  ERAS Protcol - Yes PRE-SURGERY Ensure or G2- No drink   COVID TEST- N/A   Anesthesia review: Cardiac history.  Patient denies shortness of breath, fever, cough and chest pain at PAT appointment   All instructions explained to the patient, with a verbal understanding of the material. Patient agrees to go over the instructions while at home for a better understanding.The opportunity to ask questions was provided.

## 2023-03-15 NOTE — Anesthesia Preprocedure Evaluation (Addendum)
Anesthesia Evaluation  Patient identified by MRN, date of birth, ID band Patient awake    Reviewed: Allergy & Precautions, NPO status , Patient's Chart, lab work & pertinent test results  Airway Mallampati: I  TM Distance: >3 FB Neck ROM: Full    Dental no notable dental hx. (+) Teeth Intact, Dental Advisory Given   Pulmonary shortness of breath, former smoker   Pulmonary exam normal breath sounds clear to auscultation       Cardiovascular hypertension, + CAD and + Peripheral Vascular Disease  Normal cardiovascular exam Rhythm:Regular Rate:Normal     Neuro/Psych    GI/Hepatic ,GERD  Medicated and Controlled,,  Endo/Other  negative endocrine ROS    Renal/GU negative Renal ROS     Musculoskeletal negative musculoskeletal ROS (+)    Abdominal   Peds  Hematology   Anesthesia Other Findings All: Aripiprazole  Reproductive/Obstetrics                             Anesthesia Physical Anesthesia Plan  ASA: 3  Anesthesia Plan: General   Post-op Pain Management: Ketamine IV*, Tylenol PO (pre-op)* and Dilaudid IV   Induction: Intravenous  PONV Risk Score and Plan: 3 and Treatment may vary due to age or medical condition, Midazolam and Ondansetron  Airway Management Planned: Oral ETT and Video Laryngoscope Planned  Additional Equipment: None  Intra-op Plan:   Post-operative Plan: Extubation in OR  Informed Consent: I have reviewed the patients History and Physical, chart, labs and discussed the procedure including the risks, benefits and alternatives for the proposed anesthesia with the patient or authorized representative who has indicated his/her understanding and acceptance.     Dental advisory given  Plan Discussed with: CRNA and Anesthesiologist  Anesthesia Plan Comments: (PAT note by Karoline Caldwell, PA-C: Follows with cardiology for hx of HTN, HLD, elevated CAC. Seen by Dr. Irish Lack  03/08/23 for preop eval. Per note, "Preoperative cardiovascular exam: No further cardiovascular testing needed prior to surgery.  OK to hold aspirin 7 days prior to surgery."  Follows with vascular surgery for hx of right CEA 2018. Last seen by Dr. Scot Dock 02/14/23 and CEA site noted to be widely patent. He was recommended to followup yearly.  Pt had CBC and CMP 03/11/23 at PCP office, copy on chart. Labs reviewed, WNL.  EKG 01/20/23: Sinus rhythm. Rate 70. Abnormal R wave progression, early transition. No significant change.   Carotid duplex 02/14/23: Summary:  Right Carotid: There is no evidence of stenosis in the right ICA.  Left Carotid: Velocities in the left ICA are consistent with a 1-39% stenosis.  Vertebrals:Bilateral vertebral arteries demonstrate antegrade flow.  Subclavians: Normal flow hemodynamics were seen in bilateral subclavian arteries.   Nuclear stress 05/19/19: ? The left ventricular ejection fraction is normal (55-65%). ? Nuclear stress EF: 54%. ? There was no ST segment deviation noted during stress. ? The study is normal. ? This is a low risk study.   Normal pharmacologic nuclear stress test with no evidence for a prior infarct or ischemia. LVEF 54% but appears better.    )        Anesthesia Quick Evaluation

## 2023-03-15 NOTE — Progress Notes (Signed)
Anesthesia Chart Review:  Follows with cardiology for hx of HTN, HLD, elevated CAC. Seen by Dr. Irish Lack 03/08/23 for preop eval. Per note, "Preoperative cardiovascular exam: No further cardiovascular testing needed prior to surgery.  OK to hold aspirin 7 days prior to surgery."  Follows with vascular surgery for hx of right CEA 2018. Last seen by Dr. Scot Dock 02/14/23 and CEA site noted to be widely patent. He was recommended to followup yearly.  Pt had CBC and CMP 03/11/23 at PCP office, copy on chart. Labs reviewed, WNL.  EKG 01/20/23: Sinus rhythm. Rate 70. Abnormal R wave progression, early transition. No significant change.   Carotid duplex 02/14/23: Summary:  Right Carotid: There is no evidence of stenosis in the right ICA.  Left Carotid: Velocities in the left ICA are consistent with a 1-39% stenosis.  Vertebrals: Bilateral vertebral arteries demonstrate antegrade flow.  Subclavians: Normal flow hemodynamics were seen in bilateral subclavian arteries.   Nuclear stress 05/19/19: The left ventricular ejection fraction is normal (55-65%). Nuclear stress EF: 54%. There was no ST segment deviation noted during stress. The study is normal. This is a low risk study.   Normal pharmacologic nuclear stress test with no evidence for a prior infarct or ischemia. LVEF 54% but appears better.      Scott Stuart [facility] Short Stay Center/Anesthesiology Phone [phone] 03/15/2023 4:12 PM

## 2023-03-25 ENCOUNTER — Encounter (HOSPITAL_COMMUNITY)
Admission: RE | Disposition: A | Discharge status: Home / Self Care | Payer: Self-pay | Source: Home / Self Care | Attending: Orthopedic Surgery

## 2023-03-25 ENCOUNTER — Ambulatory Visit (HOSPITAL_COMMUNITY): Payer: Commercial Managed Care - PPO

## 2023-03-25 ENCOUNTER — Other Ambulatory Visit: Payer: Self-pay

## 2023-03-25 ENCOUNTER — Ambulatory Visit (HOSPITAL_BASED_OUTPATIENT_CLINIC_OR_DEPARTMENT_OTHER): Payer: Commercial Managed Care - PPO | Admitting: Anesthesiology

## 2023-03-25 ENCOUNTER — Ambulatory Visit (HOSPITAL_COMMUNITY): Payer: Commercial Managed Care - PPO | Admitting: Physician Assistant

## 2023-03-25 ENCOUNTER — Ambulatory Visit (HOSPITAL_COMMUNITY)
Admission: RE | Admit: 2023-03-25 | Discharge: 2023-03-25 | Disposition: A | Discharge status: Home / Self Care | Payer: Commercial Managed Care - PPO | Attending: Orthopedic Surgery | Admitting: Orthopedic Surgery

## 2023-03-25 ENCOUNTER — Other Ambulatory Visit (HOSPITAL_BASED_OUTPATIENT_CLINIC_OR_DEPARTMENT_OTHER): Payer: Self-pay

## 2023-03-25 ENCOUNTER — Encounter (HOSPITAL_COMMUNITY): Payer: Self-pay | Admitting: Orthopedic Surgery

## 2023-03-25 DIAGNOSIS — I10 Essential (primary) hypertension: Secondary | ICD-10-CM | POA: Diagnosis not present

## 2023-03-25 DIAGNOSIS — M50122 Cervical disc disorder at C5-C6 level with radiculopathy: Secondary | ICD-10-CM | POA: Insufficient documentation

## 2023-03-25 DIAGNOSIS — I2510 Atherosclerotic heart disease of native coronary artery without angina pectoris: Secondary | ICD-10-CM | POA: Diagnosis not present

## 2023-03-25 DIAGNOSIS — Z981 Arthrodesis status: Secondary | ICD-10-CM | POA: Diagnosis not present

## 2023-03-25 DIAGNOSIS — K219 Gastro-esophageal reflux disease without esophagitis: Secondary | ICD-10-CM | POA: Insufficient documentation

## 2023-03-25 DIAGNOSIS — Z87891 Personal history of nicotine dependence: Secondary | ICD-10-CM | POA: Diagnosis not present

## 2023-03-25 DIAGNOSIS — M4722 Other spondylosis with radiculopathy, cervical region: Secondary | ICD-10-CM | POA: Diagnosis not present

## 2023-03-25 DIAGNOSIS — I739 Peripheral vascular disease, unspecified: Secondary | ICD-10-CM | POA: Insufficient documentation

## 2023-03-25 HISTORY — PX: CERVICAL DISC ARTHROPLASTY: SHX587

## 2023-03-25 SURGERY — CERVICAL ANTERIOR DISC ARTHROPLASTY
Anesthesia: General | Site: Spine Cervical

## 2023-03-25 MED ORDER — DEXAMETHASONE SODIUM PHOSPHATE 10 MG/ML IJ SOLN
INTRAMUSCULAR | Status: DC | PRN
  Administered 2023-03-25: 10 mg via INTRAVENOUS

## 2023-03-25 MED ORDER — ROCURONIUM BROMIDE 10 MG/ML (PF) SYRINGE
PREFILLED_SYRINGE | INTRAVENOUS | Status: AC
  Filled 2023-03-25: qty 10

## 2023-03-25 MED ORDER — PROPOFOL 10 MG/ML IV BOLUS
INTRAVENOUS | Status: AC
  Filled 2023-03-25: qty 20

## 2023-03-25 MED ORDER — ONDANSETRON HCL 4 MG/2ML IJ SOLN
INTRAMUSCULAR | Status: AC
  Filled 2023-03-25: qty 2

## 2023-03-25 MED ORDER — MIDAZOLAM HCL 2 MG/2ML IJ SOLN
INTRAMUSCULAR | Status: AC
  Filled 2023-03-25: qty 2

## 2023-03-25 MED ORDER — SUGAMMADEX SODIUM 200 MG/2ML IV SOLN
INTRAVENOUS | Status: DC | PRN
  Administered 2023-03-25: 400 mg via INTRAVENOUS

## 2023-03-25 MED ORDER — DEXAMETHASONE SODIUM PHOSPHATE 10 MG/ML IJ SOLN
INTRAMUSCULAR | Status: AC
  Filled 2023-03-25: qty 1

## 2023-03-25 MED ORDER — ROCURONIUM BROMIDE 10 MG/ML (PF) SYRINGE
PREFILLED_SYRINGE | INTRAVENOUS | Status: DC | PRN
  Administered 2023-03-25: 10 mg via INTRAVENOUS
  Administered 2023-03-25 (×2): 20 mg via INTRAVENOUS
  Administered 2023-03-25: 70 mg via INTRAVENOUS

## 2023-03-25 MED ORDER — 0.9 % SODIUM CHLORIDE (POUR BTL) OPTIME
TOPICAL | Status: DC | PRN
  Administered 2023-03-25 (×2): 1000 mL

## 2023-03-25 MED ORDER — BUPIVACAINE-EPINEPHRINE 0.25% -1:200000 IJ SOLN
INTRAMUSCULAR | Status: DC | PRN
  Administered 2023-03-25: 6 mL

## 2023-03-25 MED ORDER — LACTATED RINGERS IV SOLN: INTRAVENOUS | Status: DC | PRN

## 2023-03-25 MED ORDER — PROPOFOL 10 MG/ML IV BOLUS
INTRAVENOUS | Status: DC | PRN
  Administered 2023-03-25: 200 mg via INTRAVENOUS

## 2023-03-25 MED ORDER — ACETAMINOPHEN 10 MG/ML IV SOLN
INTRAVENOUS | Status: AC
  Filled 2023-03-25: qty 100

## 2023-03-25 MED ORDER — MIDAZOLAM HCL 2 MG/2ML IJ SOLN
INTRAMUSCULAR | Status: DC | PRN
  Administered 2023-03-25: 2 mg via INTRAVENOUS

## 2023-03-25 MED ORDER — ORAL CARE MOUTH RINSE: 15.0000 mL | Freq: Once | OROMUCOSAL | Status: AC

## 2023-03-25 MED ORDER — PHENYLEPHRINE 80 MCG/ML (10ML) SYRINGE FOR IV PUSH (FOR BLOOD PRESSURE SUPPORT)
PREFILLED_SYRINGE | INTRAVENOUS | Status: AC
  Filled 2023-03-25: qty 10

## 2023-03-25 MED ORDER — THROMBIN 20000 UNITS EX KIT
PACK | CUTANEOUS | Status: DC | PRN
  Administered 2023-03-25: 20 mL via TOPICAL

## 2023-03-25 MED ORDER — LIDOCAINE 2% (20 MG/ML) 5 ML SYRINGE
INTRAMUSCULAR | Status: DC | PRN
  Administered 2023-03-25: 100 mg via INTRAVENOUS

## 2023-03-25 MED ORDER — TRANEXAMIC ACID-NACL 1000-0.7 MG/100ML-% IV SOLN
1000.0000 mg | INTRAVENOUS | Status: AC
  Administered 2023-03-25: 1000 mg via INTRAVENOUS
  Filled 2023-03-25: qty 100

## 2023-03-25 MED ORDER — LIDOCAINE 2% (20 MG/ML) 5 ML SYRINGE
INTRAMUSCULAR | Status: AC
  Filled 2023-03-25: qty 5

## 2023-03-25 MED ORDER — OXYCODONE-ACETAMINOPHEN 10-325 MG PO TABS: 1.0000 | ORAL_TABLET | Freq: Four times a day (QID) | ORAL | 0 refills | Status: AC | PRN

## 2023-03-25 MED ORDER — HYDROMORPHONE HCL 1 MG/ML IJ SOLN
INTRAMUSCULAR | Status: AC
  Filled 2023-03-25: qty 1

## 2023-03-25 MED ORDER — SURGIFLO WITH THROMBIN (HEMOSTATIC MATRIX KIT) OPTIME
TOPICAL | Status: DC | PRN
  Administered 2023-03-25: 1 via TOPICAL

## 2023-03-25 MED ORDER — OXYCODONE HCL 5 MG/5ML PO SOLN: 5.0000 mg | Freq: Once | ORAL | Status: DC | PRN

## 2023-03-25 MED ORDER — THROMBIN 20000 UNITS EX SOLR
CUTANEOUS | Status: AC
  Filled 2023-03-25: qty 20000

## 2023-03-25 MED ORDER — HYDROMORPHONE HCL 1 MG/ML IJ SOLN
0.2500 mg | INTRAMUSCULAR | Status: DC | PRN
  Administered 2023-03-25 (×2): 0.5 mg via INTRAVENOUS

## 2023-03-25 MED ORDER — OXYCODONE HCL 5 MG PO TABS: 5.0000 mg | ORAL_TABLET | Freq: Once | ORAL | Status: DC | PRN

## 2023-03-25 MED ORDER — ACETAMINOPHEN 10 MG/ML IV SOLN
1000.0000 mg | Freq: Once | INTRAVENOUS | Status: DC | PRN
  Administered 2023-03-25: 1000 mg via INTRAVENOUS

## 2023-03-25 MED ORDER — METHOCARBAMOL 500 MG PO TABS: 500.0000 mg | ORAL_TABLET | Freq: Three times a day (TID) | ORAL | 0 refills | Status: AC | PRN

## 2023-03-25 MED ORDER — ONDANSETRON HCL 4 MG PO TABS: 4.0000 mg | ORAL_TABLET | Freq: Three times a day (TID) | ORAL | 0 refills | Status: AC | PRN

## 2023-03-25 MED ORDER — BUPIVACAINE-EPINEPHRINE (PF) 0.25% -1:200000 IJ SOLN
INTRAMUSCULAR | Status: AC
  Filled 2023-03-25: qty 30

## 2023-03-25 MED ORDER — AMISULPRIDE (ANTIEMETIC) 5 MG/2ML IV SOLN: 10.0000 mg | Freq: Once | INTRAVENOUS | Status: DC | PRN

## 2023-03-25 MED ORDER — FENTANYL CITRATE (PF) 250 MCG/5ML IJ SOLN
INTRAMUSCULAR | Status: AC
  Filled 2023-03-25: qty 5

## 2023-03-25 MED ORDER — LACTATED RINGERS IV SOLN: INTRAVENOUS | Status: DC

## 2023-03-25 MED ORDER — ONDANSETRON HCL 4 MG/2ML IJ SOLN
INTRAMUSCULAR | Status: DC | PRN
  Administered 2023-03-25: 4 mg via INTRAVENOUS

## 2023-03-25 MED ORDER — CHLORHEXIDINE GLUCONATE 0.12 % MT SOLN
15.0000 mL | Freq: Once | OROMUCOSAL | Status: AC
  Administered 2023-03-25: 15 mL via OROMUCOSAL
  Filled 2023-03-25: qty 15

## 2023-03-25 MED ORDER — CEFAZOLIN SODIUM-DEXTROSE 2-4 GM/100ML-% IV SOLN
2.0000 g | INTRAVENOUS | Status: AC
  Administered 2023-03-25: 2 g via INTRAVENOUS
  Filled 2023-03-25: qty 100

## 2023-03-25 MED ORDER — FENTANYL CITRATE (PF) 250 MCG/5ML IJ SOLN
INTRAMUSCULAR | Status: DC | PRN
  Administered 2023-03-25: 50 ug via INTRAVENOUS
  Administered 2023-03-25: 150 ug via INTRAVENOUS
  Administered 2023-03-25: 50 ug via INTRAVENOUS

## 2023-03-25 MED ORDER — PHENYLEPHRINE 80 MCG/ML (10ML) SYRINGE FOR IV PUSH (FOR BLOOD PRESSURE SUPPORT)
PREFILLED_SYRINGE | INTRAVENOUS | Status: DC | PRN
  Administered 2023-03-25 (×3): 80 ug via INTRAVENOUS

## 2023-03-25 MED ORDER — ONDANSETRON HCL 4 MG/2ML IJ SOLN: 4.0000 mg | Freq: Once | INTRAMUSCULAR | Status: DC | PRN

## 2023-03-25 MED ORDER — EPHEDRINE 5 MG/ML INJ
INTRAVENOUS | Status: AC
  Filled 2023-03-25: qty 5

## 2023-03-25 SURGICAL SUPPLY — 55 items
AGENT HMST KT MTR STRL THRMB (HEMOSTASIS) ×1
BAG COUNTER SPONGE SURGICOUNT (BAG) ×1 IMPLANT
BAG SPNG CNTER NS LX DISP (BAG) ×1
BLADE CLIPPER SURG (BLADE) IMPLANT
CANISTER SUCT 3000ML PPV (MISCELLANEOUS) ×1 IMPLANT
CLSR STERI-STRIP ANTIMIC 1/2X4 (GAUZE/BANDAGES/DRESSINGS) ×1 IMPLANT
COVER MAYO STAND STRL (DRAPES) ×2 IMPLANT
COVER SURGICAL LIGHT HANDLE (MISCELLANEOUS) ×2 IMPLANT
DISC SIMPLIFY 2 HT4 (Neuro Prosthesis/Implant) IMPLANT
DRAIN CHANNEL 15F RND FF W/TCR (WOUND CARE) IMPLANT
DRAPE C-ARM 42X72 X-RAY (DRAPES) ×1 IMPLANT
DRAPE C-ARMOR (DRAPES) IMPLANT
DRAPE POUCH INSTRU U-SHP 10X18 (DRAPES) ×1 IMPLANT
DRAPE SURG 17X23 STRL (DRAPES) ×1 IMPLANT
DRAPE U-SHAPE 47X51 STRL (DRAPES) ×1 IMPLANT
DRSG OPSITE POSTOP 3X4 (GAUZE/BANDAGES/DRESSINGS) ×1 IMPLANT
DRSG OPSITE POSTOP 4X6 (GAUZE/BANDAGES/DRESSINGS) IMPLANT
DURAPREP 26ML APPLICATOR (WOUND CARE) ×1 IMPLANT
ELECT COATED BLADE 2.86 ST (ELECTRODE) ×1 IMPLANT
ELECT PENCIL ROCKER SW 15FT (MISCELLANEOUS) ×1 IMPLANT
ELECT REM PT RETURN 9FT ADLT (ELECTROSURGICAL) ×1
ELECTRODE REM PT RTRN 9FT ADLT (ELECTROSURGICAL) ×1 IMPLANT
GLOVE BIO SURGEON STRL SZ 6.5 (GLOVE) ×1 IMPLANT
GLOVE BIOGEL PI IND STRL 6.5 (GLOVE) ×1 IMPLANT
GLOVE BIOGEL PI IND STRL 8.5 (GLOVE) ×1 IMPLANT
GLOVE SS BIOGEL STRL SZ 8.5 (GLOVE) ×1 IMPLANT
GOWN STRL REUS W/ TWL LRG LVL3 (GOWN DISPOSABLE) ×2 IMPLANT
GOWN STRL REUS W/TWL 2XL LVL3 (GOWN DISPOSABLE) ×1 IMPLANT
GOWN STRL REUS W/TWL LRG LVL3 (GOWN DISPOSABLE) ×2
KIT BASIN OR (CUSTOM PROCEDURE TRAY) ×1 IMPLANT
KIT TURNOVER KIT B (KITS) ×1 IMPLANT
NDL SPNL 18GX3.5 QUINCKE PK (NEEDLE) ×1 IMPLANT
NEEDLE SPNL 18GX3.5 QUINCKE PK (NEEDLE) ×1 IMPLANT
NS IRRIG 1000ML POUR BTL (IV SOLUTION) ×1 IMPLANT
PACK ORTHO CERVICAL (CUSTOM PROCEDURE TRAY) ×1 IMPLANT
PACK UNIVERSAL I (CUSTOM PROCEDURE TRAY) ×1 IMPLANT
PAD ARMBOARD 7.5X6 YLW CONV (MISCELLANEOUS) ×2 IMPLANT
PIN DISTRACTION MAXCESS-C 14 (PIN) IMPLANT
POSITIONER HEAD DONUT 9IN (MISCELLANEOUS) ×1 IMPLANT
RESTRAINT LIMB HOLDER UNIV (RESTRAINTS) ×1 IMPLANT
SPONGE INTESTINAL PEANUT (DISPOSABLE) IMPLANT
SPONGE SURGIFOAM ABS GEL SZ50 (HEMOSTASIS) ×1 IMPLANT
SURGIFLO W/THROMBIN 8M KIT (HEMOSTASIS) ×1 IMPLANT
SUT BONE WAX W31G (SUTURE) ×1 IMPLANT
SUT MNCRL AB 3-0 PS2 27 (SUTURE) ×1 IMPLANT
SUT SILK 3 0 TIES 17X18 (SUTURE) ×1
SUT SILK 3-0 18XBRD TIE BLK (SUTURE) IMPLANT
SUT VIC AB 2-0 CT1 18 (SUTURE) ×1 IMPLANT
SYR BULB IRRIG 60ML STRL (SYRINGE) ×1 IMPLANT
SYR CONTROL 10ML LL (SYRINGE) IMPLANT
TAPE CLOTH 4X10 WHT NS (GAUZE/BANDAGES/DRESSINGS) ×1 IMPLANT
TAPE UMBILICAL 1/8X30 (MISCELLANEOUS) ×1 IMPLANT
TOWEL GREEN STERILE (TOWEL DISPOSABLE) ×1 IMPLANT
TOWEL GREEN STERILE FF (TOWEL DISPOSABLE) ×1 IMPLANT
WATER STERILE IRR 1000ML POUR (IV SOLUTION) ×1 IMPLANT

## 2023-03-25 NOTE — Brief Op Note (Signed)
03/25/2023  4:43 PM  PATIENT:  Scott Stuart  62 y.o. 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

## 2023-03-25 NOTE — H&P (Signed)
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 Patient has failed conservative management as result we will move forward with a total disc arthroplasty at C5-6.  Past Medical History:  Diagnosis Date   Anxiety    Bipolar 1 disorder    CAD (coronary artery disease)    status carotid endarterectomy   Colon polyps    Depression    ED (erectile dysfunction)    Family history of adverse reaction to anesthesia    Mother: nausea and vomiting when waken up   Fatty liver    Frequent headaches    GERD (gastroesophageal reflux disease)    Hyperlipidemia    Hypertension    Hypertriglyceridemia    Low testosterone    Mass of finger of left hand    Otitis media    Peripheral vascular disease    Pneumonia    As a child   Right-sided carotid artery disease 05/31/2017   Strep throat    Substance abuse    history of alcohol abuse: quit 10 years ago!   TIA (transient ischemic attack)    TIA (transient ischemic attack) 2018    Allergies  Allergen Reactions   Aripiprazole Other (See Comments)    chest pain, hypertension, slurred speech    No current facility-administered medications on file prior to encounter.   Current Outpatient Medications on File Prior to Encounter  Medication Sig Dispense Refill   acetaminophen (TYLENOL) 325 MG tablet Take 650 mg by mouth every 6 (six) hours as needed for headache.     aspirin EC 81 MG tablet Take 81 mg by mouth daily. On Hold     famotidine (PEPCID) 20 MG tablet Take 20 mg by mouth as needed for heartburn or indigestion.     rosuvastatin (CRESTOR) 20 MG tablet Take 1 tablet (20 mg total) by mouth daily. 90 tablet 3   ketoconazole (NIZORAL) 2 % cream Apply 1 application Externally Once a day (Patient not taking: Reported on 03/12/2023) 90 g 0    Physical Exam: Vitals:   03/25/23 1122  BP: (!) 152/96  Pulse: 74  Resp: 20  Temp: 98.3 F (36.8 C)  SpO2: 96%    Body mass index is 29.69 kg/m. Clinical exam: Scott Stuart is a pleasant individual, who appears younger than their stated age.  Scott Stuart is alert and orientated 3.  No shortness of breath, chest pain.  Abdomen is soft and non-tender, negative loss of bowel and bladder control, no rebound tenderness.  Negative: skin lesions abrasions contusions  Peripheral pulses: 2+ peripheral pulses bilaterally. LE compartments are: Soft and nontender.  Gait pattern: normal  Assistive devices: none  Neuro: 5/5 motor strength in the upper extremity bilaterally. Negative Hoffman test, no clonus, symmetrical 2+ deep tendon reflexes in the upper extremity. Positive numbness and dysesthesias in the left C6 dermatome. Positive Spurling sign with reproduction of radicular left arm pain. Negative Lhermitte sign.  Musculoskeletal: Significant neck pain radiating into the paraspinal region. Occasional crepitus and occipital headaches. No significant shoulder, elbow, wrist pain  Imaging: X-rays of the cervical spine demonstrate slight loss in overall cervical lordosis. Positive degenerative disc changes at C5-6. No fractures seen.  Cervical MRI: completed on 02/04/2023. No cord signal changes. Right posterior lateral disc protrusion at C3-4 contacting the right C4 nerve root. Moderate degenerative changes C5-6 with moderate to severe foraminal narrowing bilaterally. No significant stenosis at the remainder of the levels.  Patient had 2 to  3 days of significant improvement following the cervical epidural steroid injection. No relief following the C4 selective nerve root block.  A/P: Summary: Scott Stuart is a very pleasant 62 year old gentleman who has had 3 months of significant neck and progressive radicular left arm pain. Since his last visit with me his left arm symptoms have progressed. He now has signs of radiculopathy and nerve root dysfunction with numbness and dysesthesias. Fortunately he does not show any  signs of myelopathy. He is attempted physical therapy but this only serve to intensify his neck and radicular arm pain and so it was stopped. He has been unable to work because of the severity of his pain. Today the only thing that provided some semblance of improvement was the cervical epidural steroid injection. Based on his clinical presentation, imaging studies, and objective clinical findings I think the primary pain generator is the degenerative disc at C5-6. This would result in C6 nerve irritation which would account for his left radicular arm symptoms. At this point he is expressed a desire to move forward with surgical correction of this problem. Based on his clinical exam I think the best option is a cervical disc replacement at C5-6. His facet joints are in good condition there is no significant arthrosis and he has isolated single level pathology. The primary goal of surgery is to reduce not eliminate his pain and improve his quality of life. I have gone over the surgical procedure and provided him with literature and all of his and his wife's questions were addressed. Risks and benefits of surgery were discussed with the patient. These include: Infection, bleeding, death, stroke, paralysis, ongoing or worse pain, need for additional surgery, nonunion, leak of spinal fluid, adjacent segment degeneration requiring additional fusion surgery. Pseudoarthrosis (nonunion)requiring supplemental posterior fixation. Throat pain, swallowing difficulties, hoarseness or change in voice. Heterotopic ossification, inability to place the disc due to technical issues requiring bailout to a fusion procedure.

## 2023-03-25 NOTE — Anesthesia Procedure Notes (Signed)
Procedure Name: Intubation Date/Time: 03/25/2023 1:46 PM  Performed by: Lind Guest, CRNAPre-anesthesia Checklist: Patient identified, Emergency Drugs available, Suction available, Patient being monitored and Timeout performed Patient Re-evaluated:Patient Re-evaluated prior to induction Oxygen Delivery Method: Circle system utilized Preoxygenation: Pre-oxygenation with 100% oxygen Induction Type: IV induction Ventilation: Mask ventilation without difficulty Laryngoscope Size: Glidescope and 4 Grade View: Grade I Tube type: Oral Tube size: 7.5 mm Number of attempts: 1 Placement Confirmation: ETT inserted through vocal cords under direct vision, positive ETCO2 and breath sounds checked- equal and bilateral Secured at: 23 cm Tube secured with: Tape Dental Injury: Teeth and Oropharynx as per pre-operative assessment

## 2023-03-25 NOTE — Discharge Instructions (Addendum)

## 2023-03-25 NOTE — Transfer of Care (Signed)
Immediate Anesthesia Transfer of Care Note  Patient: Scott Stuart  Procedure(s) Performed: cervical disc replacement cervical five to six (Spine Cervical)  Patient Location: PACU  Anesthesia Type:General  Level of Consciousness: awake, oriented, and patient cooperative  Airway & Oxygen Therapy: Patient Spontanous Breathing and Patient connected to nasal cannula oxygen  Post-op Assessment: Report given to RN and Post -op Vital signs reviewed and stable  Post vital signs: Reviewed and stable  Last Vitals:  Vitals Value Taken Time  BP    Temp    Pulse    Resp    SpO2      Last Pain:  Vitals:   03/25/23 1132  TempSrc:   PainSc: 0-No pain         Complications: No notable events documented.

## 2023-03-25 NOTE — Anesthesia Postprocedure Evaluation (Signed)
Anesthesia Post Note  Patient: Scott Stuart  Procedure(s) Performed: cervical disc replacement cervical five to six (Spine Cervical)     Patient location during evaluation: PACU Anesthesia Type: General Level of consciousness: sedated Pain management: pain level controlled Vital Signs Assessment: post-procedure vital signs reviewed and stable Respiratory status: spontaneous breathing and respiratory function stable Cardiovascular status: stable Postop Assessment: no apparent nausea or vomiting Anesthetic complications: no  No notable events documented.  Last Vitals:  Vitals:   03/25/23 1715 03/25/23 1730  BP: (!) 147/100 (!) 145/90  Pulse: 81 80  Resp: 10 13  Temp:    SpO2: 91% 92%                 Marrie Chandra DANIEL

## 2023-03-25 NOTE — Op Note (Signed)
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  BRIEF HISTORY: Scott Stuart is a 62 y.o. male who has had acute severe neck and radicular right arm pain.  Attempts at conservative management have failed to alleviate his symptoms and improve his quality of life.  As result we elected to move forward with surgery.  All appropriate risks, benefits, alternatives as well as goals of surgery were discussed with the patient and his wife and all their questions were addressed.  Consent was obtained.  PROCEDURE DETAILS: Patient was brought into the operating room and was properly positioned on the operating room table.  After induction with general anesthesia the patient was endotracheally intubated.  A timeout was taken to confirm all important data: including patient, procedure, and the level. Teds, SCD's were applied.   The anterior cervical spine was prepped and draped in a standard fashion.  Using fluoroscopy I marked out the C5-6 disc space level and infiltrated the incision site with quarter percent Marcaine with epinephrine.  A transverse left-sided incision was made and sharp dissection was carried out down to and through the platysma.  I continued to dissect along the avascular plane between the sternocleidomastoid and the trachea.  This is a standard Smith-Robinson approach.  I continued to sharply dissect sweeping the esophagus and trachea to the right and identified and protected the carotid sheath with my finger.  Hand-held retractor was then placed and using Kitner dissectors I  completed the dissection by removing the remainder of the prevertebral fascia to expose the anterior longitudinal ligament.  Once I had exposed the anterior longitudinal ligament I placed a needle into the C5-6 disc space.  An intraoperative fluoroscopy image was taken confirming I was at the appropriate level.  Once this was confirmed with the staff in the room an annulotomy was performed with a 15 blade scalpel.  The disc material was removed with pituitary rongeurs.  I then trimmed down the leading osteophyte from the inferior aspect of the C5 vertebral body.  I then placed distraction pins into the body of C5 and C6.  Using AP and lateral fluoroscopy to confirm that the pins were directly in the midline.  I distracted the intervertebral space with a lamina spreader and maintained it with the distraction pin set.  Using curettes I continued to remove all of the disc material until I was at the posterior annulus.  I then used my fine nerve hook to continue dissecting posteriorly.  Using a 1 mm Kerrison I remove the remainder of the disc material and expose the posterior longitudinal ligament.  I then continued to gently dissect through the posterior longitudinal ligament until I was able to create a plane between the PLL and the thecal sac.  I then used my 1 mm Kerrison rongeur to resect the PLL.  This allowed me to undercut the uncovertebral joints to further decompress the nerve root.  I could now freely pass my nerve hook on the uncovertebral joints and vertebral body.  I confirmed with x-ray that an adequate decompression.  Under live fluoroscopy I was able to demonstrate parallel endplate distraction.  With the discectomy complete I move forward with the implantation.  Trial implants were placed in the medium 4 mm disc provided the best overall position and so I elected to use the size.  The osteotome was then used to broach the endplates in preparation for the implant.  I irrigated wound copiously normal  saline cleaned out the osteotome tracks with a small nerve hook and make sure that hemostasis.  The implant was obtained and then gently malleted to the appropriate depth.  I confirmed satisfactory position of the implant in both the AP and lateral planes.  Once this was confirmed I irrigated the wound copiously with normal saline.  Distraction pins were removed and the resulting holes were sealed with bone wax.  After final irrigation I confirmed hemostasis and then returned the trach and esophagus to midline.  The platysma was closed with interrupted 2-0 Vicryl sutures and the skin with 3-0 Monocryl.  Steri-Strips and dry dressings were applied and the patient was extubated transferred to PACU without incident.  The end of the case all needle sponge counts were correct there were no adverse intraoperative events.  Melina Schools, MD 03/25/2023 4:35 PM

## 2023-03-27 ENCOUNTER — Encounter (HOSPITAL_COMMUNITY): Payer: Self-pay | Admitting: Orthopedic Surgery

## 2023-04-05 ENCOUNTER — Other Ambulatory Visit (HOSPITAL_BASED_OUTPATIENT_CLINIC_OR_DEPARTMENT_OTHER): Payer: Self-pay

## 2023-04-05 MED ORDER — OXYCODONE-ACETAMINOPHEN 10-325 MG PO TABS
1.0000 | ORAL_TABLET | Freq: Three times a day (TID) | ORAL | 0 refills | Status: AC | PRN
  Filled 2023-04-05: qty 42, 14d supply, fill #0

## 2023-04-05 MED ORDER — METHOCARBAMOL 500 MG PO TABS
500.0000 mg | ORAL_TABLET | Freq: Three times a day (TID) | ORAL | 0 refills | Status: AC
  Filled 2023-04-05: qty 42, 14d supply, fill #0

## 2023-04-09 ENCOUNTER — Other Ambulatory Visit (HOSPITAL_BASED_OUTPATIENT_CLINIC_OR_DEPARTMENT_OTHER): Payer: Self-pay

## 2023-04-09 MED ORDER — FAMOTIDINE 40 MG PO TABS
40.0000 mg | ORAL_TABLET | Freq: Every day | ORAL | 0 refills | Status: AC
  Filled 2023-04-09: qty 30, 30d supply, fill #0

## 2023-05-03 DIAGNOSIS — Z4889 Encounter for other specified surgical aftercare: Secondary | ICD-10-CM | POA: Diagnosis not present

## 2023-05-09 DIAGNOSIS — M542 Cervicalgia: Secondary | ICD-10-CM | POA: Diagnosis not present

## 2023-05-30 ENCOUNTER — Other Ambulatory Visit (HOSPITAL_BASED_OUTPATIENT_CLINIC_OR_DEPARTMENT_OTHER): Payer: Self-pay

## 2023-05-30 DIAGNOSIS — K219 Gastro-esophageal reflux disease without esophagitis: Secondary | ICD-10-CM | POA: Diagnosis not present

## 2023-05-30 DIAGNOSIS — Z86010 Personal history of colonic polyps: Secondary | ICD-10-CM | POA: Diagnosis not present

## 2023-05-30 DIAGNOSIS — Z1211 Encounter for screening for malignant neoplasm of colon: Secondary | ICD-10-CM | POA: Diagnosis not present

## 2023-05-30 DIAGNOSIS — E669 Obesity, unspecified: Secondary | ICD-10-CM | POA: Diagnosis not present

## 2023-05-30 DIAGNOSIS — K625 Hemorrhage of anus and rectum: Secondary | ICD-10-CM | POA: Diagnosis not present

## 2023-05-30 MED ORDER — FAMOTIDINE 40 MG PO TABS
40.0000 mg | ORAL_TABLET | Freq: Two times a day (BID) | ORAL | 4 refills | Status: AC
  Filled 2023-05-30: qty 180, 90d supply, fill #0
  Filled 2023-09-01: qty 180, 90d supply, fill #1

## 2023-05-31 ENCOUNTER — Encounter (HOSPITAL_BASED_OUTPATIENT_CLINIC_OR_DEPARTMENT_OTHER): Payer: Self-pay

## 2023-05-31 ENCOUNTER — Other Ambulatory Visit (HOSPITAL_BASED_OUTPATIENT_CLINIC_OR_DEPARTMENT_OTHER): Payer: Self-pay

## 2023-06-05 ENCOUNTER — Other Ambulatory Visit (HOSPITAL_BASED_OUTPATIENT_CLINIC_OR_DEPARTMENT_OTHER): Payer: Self-pay

## 2023-06-14 ENCOUNTER — Other Ambulatory Visit (HOSPITAL_BASED_OUTPATIENT_CLINIC_OR_DEPARTMENT_OTHER): Payer: Self-pay

## 2023-06-14 DIAGNOSIS — Z4889 Encounter for other specified surgical aftercare: Secondary | ICD-10-CM | POA: Diagnosis not present

## 2023-06-14 MED ORDER — METHYLPREDNISOLONE 4 MG PO TBPK
ORAL_TABLET | ORAL | 0 refills | Status: AC
  Filled 2023-06-14: qty 21, 6d supply, fill #0

## 2023-07-01 ENCOUNTER — Telehealth: Payer: Self-pay

## 2023-07-01 DIAGNOSIS — I779 Disorder of arteries and arterioles, unspecified: Secondary | ICD-10-CM

## 2023-07-01 NOTE — Telephone Encounter (Signed)
Patient messaged nurse wondering if he still had an appointment on July 11. Patient saw Dr. Eldridge Dace recently for clearance, so his appointment was canceled. Will place order for carotids in February and follow-up after.

## 2023-07-04 ENCOUNTER — Ambulatory Visit: Payer: Commercial Managed Care - PPO | Admitting: Cardiovascular Disease

## 2023-07-11 DIAGNOSIS — I779 Disorder of arteries and arterioles, unspecified: Secondary | ICD-10-CM | POA: Diagnosis not present

## 2023-07-11 DIAGNOSIS — Z0000 Encounter for general adult medical examination without abnormal findings: Secondary | ICD-10-CM | POA: Diagnosis not present

## 2023-07-11 DIAGNOSIS — E291 Testicular hypofunction: Secondary | ICD-10-CM | POA: Diagnosis not present

## 2023-07-11 DIAGNOSIS — E785 Hyperlipidemia, unspecified: Secondary | ICD-10-CM | POA: Diagnosis not present

## 2023-07-11 DIAGNOSIS — Z6829 Body mass index (BMI) 29.0-29.9, adult: Secondary | ICD-10-CM | POA: Diagnosis not present

## 2023-07-11 DIAGNOSIS — R7989 Other specified abnormal findings of blood chemistry: Secondary | ICD-10-CM | POA: Diagnosis not present

## 2023-07-11 DIAGNOSIS — I10 Essential (primary) hypertension: Secondary | ICD-10-CM | POA: Diagnosis not present

## 2023-10-24 ENCOUNTER — Other Ambulatory Visit (HOSPITAL_BASED_OUTPATIENT_CLINIC_OR_DEPARTMENT_OTHER): Payer: Self-pay

## 2023-10-24 DIAGNOSIS — S161XXA Strain of muscle, fascia and tendon at neck level, initial encounter: Secondary | ICD-10-CM | POA: Diagnosis not present

## 2023-10-24 DIAGNOSIS — Z4889 Encounter for other specified surgical aftercare: Secondary | ICD-10-CM | POA: Diagnosis not present

## 2023-10-24 DIAGNOSIS — M542 Cervicalgia: Secondary | ICD-10-CM | POA: Diagnosis not present

## 2023-10-24 MED ORDER — METHYLPREDNISOLONE 4 MG PO TBPK
ORAL_TABLET | ORAL | 0 refills | Status: AC
  Filled 2023-10-24: qty 21, 6d supply, fill #0

## 2023-10-24 MED ORDER — TIZANIDINE HCL 2 MG PO TABS
2.0000 mg | ORAL_TABLET | Freq: Every day | ORAL | 0 refills | Status: AC
  Filled 2023-10-24: qty 21, 21d supply, fill #0

## 2023-11-12 ENCOUNTER — Other Ambulatory Visit (HOSPITAL_BASED_OUTPATIENT_CLINIC_OR_DEPARTMENT_OTHER): Payer: Self-pay

## 2023-11-12 MED ORDER — GOLYTELY 236 G PO SOLR
4000.0000 mL | ORAL | 0 refills | Status: AC
  Filled 2023-11-12: qty 4000, 1d supply, fill #0

## 2023-11-15 DIAGNOSIS — Z860101 Personal history of adenomatous and serrated colon polyps: Secondary | ICD-10-CM | POA: Diagnosis not present

## 2023-11-15 DIAGNOSIS — K2100 Gastro-esophageal reflux disease with esophagitis, without bleeding: Secondary | ICD-10-CM | POA: Diagnosis not present

## 2023-11-15 DIAGNOSIS — Z860102 Personal history of hyperplastic colon polyps: Secondary | ICD-10-CM | POA: Diagnosis not present

## 2023-11-15 DIAGNOSIS — D128 Benign neoplasm of rectum: Secondary | ICD-10-CM | POA: Diagnosis not present

## 2023-11-15 DIAGNOSIS — K319 Disease of stomach and duodenum, unspecified: Secondary | ICD-10-CM | POA: Diagnosis not present

## 2023-11-15 DIAGNOSIS — K635 Polyp of colon: Secondary | ICD-10-CM | POA: Diagnosis not present

## 2023-11-15 DIAGNOSIS — K3189 Other diseases of stomach and duodenum: Secondary | ICD-10-CM | POA: Diagnosis not present

## 2023-11-15 DIAGNOSIS — K449 Diaphragmatic hernia without obstruction or gangrene: Secondary | ICD-10-CM | POA: Diagnosis not present

## 2023-11-15 DIAGNOSIS — K31A11 Gastric intestinal metaplasia without dysplasia, involving the antrum: Secondary | ICD-10-CM | POA: Diagnosis not present

## 2023-11-15 DIAGNOSIS — Z1211 Encounter for screening for malignant neoplasm of colon: Secondary | ICD-10-CM | POA: Diagnosis not present

## 2023-11-15 DIAGNOSIS — K621 Rectal polyp: Secondary | ICD-10-CM | POA: Diagnosis not present

## 2023-11-15 DIAGNOSIS — R12 Heartburn: Secondary | ICD-10-CM | POA: Diagnosis not present

## 2023-11-15 DIAGNOSIS — E669 Obesity, unspecified: Secondary | ICD-10-CM | POA: Diagnosis not present

## 2023-11-18 ENCOUNTER — Other Ambulatory Visit (HOSPITAL_BASED_OUTPATIENT_CLINIC_OR_DEPARTMENT_OTHER): Payer: Self-pay

## 2023-11-20 ENCOUNTER — Other Ambulatory Visit (HOSPITAL_BASED_OUTPATIENT_CLINIC_OR_DEPARTMENT_OTHER): Payer: Self-pay

## 2023-11-20 ENCOUNTER — Other Ambulatory Visit: Payer: Self-pay

## 2023-11-20 MED ORDER — OMEPRAZOLE 40 MG PO CPDR
40.0000 mg | DELAYED_RELEASE_CAPSULE | Freq: Every day | ORAL | 5 refills | Status: AC
  Filled 2023-11-20: qty 30, 30d supply, fill #0
  Filled 2024-03-02: qty 30, 30d supply, fill #1
  Filled 2024-08-06: qty 30, 30d supply, fill #2
  Filled 2024-09-08: qty 30, 30d supply, fill #3
  Filled 2024-09-11: qty 30, 30d supply, fill #0

## 2024-01-27 ENCOUNTER — Telehealth: Payer: Self-pay

## 2024-01-27 DIAGNOSIS — I779 Disorder of arteries and arterioles, unspecified: Secondary | ICD-10-CM

## 2024-01-27 NOTE — Telephone Encounter (Signed)
Per Dr. Eden Emms, will have carotid duplex done in 1 year. Last carotid did not show any significant stenosis. Scheduled patient for regular follow-up with Dr. Eden Emms. Placed order for carotid.

## 2024-01-27 NOTE — Telephone Encounter (Signed)
Per Patient message "[name].  Do you know if I was ever scheduled for a Carotid US and f/u with Dr Eden Emms sometime in February?   thank you"   Per patient Dr. Edilia Bo wants cardiology to follow-up with yearly carotids. Will sent message to Dr. Eden Emms to get okay to order carotid and office visit. Last carotid was 01/2023.

## 2024-01-30 DIAGNOSIS — H5211 Myopia, right eye: Secondary | ICD-10-CM | POA: Diagnosis not present

## 2024-02-21 ENCOUNTER — Other Ambulatory Visit (HOSPITAL_BASED_OUTPATIENT_CLINIC_OR_DEPARTMENT_OTHER): Payer: Self-pay

## 2024-03-02 ENCOUNTER — Other Ambulatory Visit (HOSPITAL_BASED_OUTPATIENT_CLINIC_OR_DEPARTMENT_OTHER): Payer: Self-pay

## 2024-05-21 ENCOUNTER — Telehealth: Payer: Self-pay

## 2024-05-21 DIAGNOSIS — I779 Disorder of arteries and arterioles, unspecified: Secondary | ICD-10-CM

## 2024-05-21 DIAGNOSIS — I2510 Atherosclerotic heart disease of native coronary artery without angina pectoris: Secondary | ICD-10-CM

## 2024-05-21 DIAGNOSIS — E782 Mixed hyperlipidemia: Secondary | ICD-10-CM

## 2024-05-21 NOTE — Telephone Encounter (Signed)
-----   Message from Janelle Mediate sent at 05/21/2024  2:37 PM EDT ----- Does he have recent labs with a primary Needs lipid, liver and LpA

## 2024-05-21 NOTE — Telephone Encounter (Signed)
 Patient will have lab work done in the next couple of weeks. Orders have been placed and released.

## 2024-05-21 NOTE — Progress Notes (Signed)
 Cardiology Consultation:   Patient ID: Scott Stuart MRN: [id number]; DOB: [birth year]  Admit date: (Not on file) Date of Consult: 06/04/2024  Primary Care Provider: Darnelle Elders, PA-C Primary Cardiologist: Vergia Glasgow / Stann Earnest Primary Electrophysiologist:  None     History of Present Illness:    Scott Stuart is a 63 y.o. 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  Calcium  score done 05/11/19 which was 419 or 85 th percentile for age and sex Most of the calcium  was in the LAD. He subsequently had a lexiscan  myovue ( no ETT due to COVID restrictions)  On 05/19/19 which was normal EF 54% He has significant anxiety No chest pain Fairly sedentary except for walking his [place]   Seen by Dr Anitra Ket 03/08/23 and cleared for cervical spine surgery Had uncomplicated C56 disc replacement with Dr Vaughn Georges 03/25/23   Carotid duplex 02/14/23 with mild plaque in Left ICA no stenosis in right ICA   Not active Does yard work. Complains of pain/tingling in legs going up stairs to our coumadin clinic at work. Thinking of moving to [facility] in 2 years   Discussed non compliance with statin LDL 99 with target 55   Past Medical History:  Diagnosis Date   Anxiety    Bipolar 1 disorder (HCC)    CAD (coronary artery disease)    status carotid endarterectomy   Colon polyps    Depression    ED (erectile dysfunction)    Family history of adverse reaction to anesthesia    Mother: nausea and vomiting when waken up   Fatty liver    Frequent headaches    GERD (gastroesophageal reflux disease)    Hyperlipidemia    Hypertension    Hypertriglyceridemia    Low testosterone     Mass of finger of left hand    Otitis media    Peripheral vascular disease (HCC)    Pneumonia    As a child   Right-sided carotid artery disease (HCC) 05/31/2017   Strep  throat    Substance abuse (HCC)    history of alcohol abuse: quit 10 years ago!   TIA (transient ischemic attack)    TIA (transient ischemic attack) 2018    Past Surgical History:  Procedure Laterality Date   APPENDECTOMY     CAROTID ENDARTERECTOMY Right 07/26/2017   neck   CERVICAL DISC ARTHROPLASTY N/A 03/25/2023   Procedure: cervical disc replacement cervical five to six;  Surgeon: Mort Ards, MD;  Location: [facility] OR;  Service: Orthopedics;  Laterality: N/A;    colonscopy     FINGER GANGLION CYST EXCISION Left 11/13/2016   HERNIA REPAIR     LAMINECTOMY     LASIK Bilateral 05/21/2002   STEROID INJECTION TO SCAR Right 11/13/2016   hip   TONSILLECTOMY     TRIGGER FINGER RELEASE Left 01/14/2016     Home Medications:  Prior to Admission medications   Medication Sig Start Date End Date Taking? Authorizing Provider  ALPRAZolam (XANAX) 0.25 MG tablet Take 0.25 mg by mouth daily as needed for anxiety. 01/07/18   [provider]  aspirin EC 81 MG tablet Take 81 mg by mouth every evening.     [provider]  atorvastatin (LIPITOR) 40 MG tablet Take 40 mg by mouth every evening. 11/25/17  [provider]  buPROPion (WELLBUTRIN XL) 300 MG 24 hr tablet Take 300 mg by mouth daily.    [provider]  lamoTRIgine (LAMICTAL) 200 MG tablet Take 200 mg by mouth 2 (two) times daily.    [provider]  losartan (COZAAR) 25 MG tablet Take 25 mg by mouth every evening.     [provider]  lurasidone (LATUDA) 40 MG TABS tablet Take 40 mg by mouth every evening.     [provider]  meclizine  (ANTIVERT ) 25 MG tablet Take 1 tablet (25 mg total) by mouth 3 (three) times daily as needed for dizziness. 02/04/18   Lindle Rhea, MD  metoprolol tartrate (LOPRESSOR) 25 MG tablet Take 25 mg by mouth every evening.    [provider]  Omega-3 Fatty Acids (FISH OIL PO) Take 1,000 mg by mouth 2 (two) times daily.    [provider]     Allergies:    Allergies  Allergen Reactions   Aripiprazole Other (See Comments)    chest pain, hypertension, slurred speech    Social History:   Social History   Socioeconomic History   Marital status: Married    Spouse name: Not on file   Number of children: Not on file   Years of education: Not on file   Highest education level: Not on file  Occupational History   Not on file  Tobacco Use   Smoking status: Former    Current packs/day: 0.00    Types: Cigarettes    Quit date: 05/27/2019    Years since quitting: 5.0   Smokeless tobacco: Never  Vaping Use   Vaping status: Never Used  Substance and Sexual Activity   Alcohol use: No    Comment: No longer drinks alcohol    Drug use: No   Sexual activity: Yes    Comment: married  Other Topics Concern   Not on file  Social History Narrative   Not on file   Social Drivers of Health   Financial Resource Strain: Not on file  Food Insecurity: Not on file  Transportation Needs: Not on file  Physical Activity: Not on file  Stress: Not on file  Social Connections: Not on file  Intimate Partner Violence: Not on file    Family History:    Family History  Problem Relation Age of Onset   Stroke Father    Hyperlipidemia Father    Hypertension Father    Anesthesia problems Mother    Cancer Mother    Cataracts Mother    Depression Mother    Other Mother        musclar degeneration   Thyroid disease Mother    Thyroid disease Sister    Heart disease Paternal Grandfather      ROS:  Please see the history of present illness.   All other ROS reviewed and negative.     Physical Exam/Data:   Vitals:   06/04/24 0835  BP: (!) 126/92  Pulse: 74  SpO2: 96%  Weight: 217 lb (98.4 kg)  Height: 6' 1 (1.854 m)    @IOBRIEF @    06/04/2024    8:35 AM 03/25/2023   11:22 AM 03/14/2023    8:02 AM  Last 3 Weights  Weight (lbs) 217 lb 225 lb 225 lb 9.6 oz  Weight (kg) 98.431 kg 102.059 kg 102.331 kg      Body mass index is 28.63 kg/m.  General:  Anxious white male  HEENT: Post R CEA  Lymph: no adenopathy Neck: no JVD Endocrine:  No thryomegaly Vascular: No carotid bruits; FA pulses 2+ bilaterally without bruits  Cardiac:  normal S1, S2; RRR; no murmur   Lungs:  clear to auscultation bilaterally, no wheezing, rhonchi or rales  Abd: soft, nontender, no hepatomegaly  Ext: no edema Musculoskeletal:  No deformities, BUE and BLE strength normal and equal Skin: warm and dry  Neuro:  CNs 2-12 intact, no focal abnormalities noted Psych:  Normal affect   EKG: 06/04/2024  NSR rate 74 normal    Relevant CV Studies: Myovue 05/19/19 Calcium  Score 05/06/19   Laboratory Data:  ChemistryNo results for input(s): NA, K, CL, CO2, GLUCOSE, BUN, CREATININE, CALCIUM , GFRNONAA, GFRAA, ANIONGAP in the last 168 hours.  No results for input(s): PROT, ALBUMIN, AST, ALT, ALKPHOS, BILITOT in the last 168 hours. HematologyNo results for input(s): WBC, RBC, HGB, HCT, MCV, MCH, MCHC, RDW, PLT in the last 168 hours. Cardiac EnzymesNo results for input(s): TROPONINI in the last 168 hours. No results for input(s): TROPIPOC in the last 168 hours.  BNPNo results for input(s): BNP, PROBNP in the last 168 hours.  DDimer No results for input(s): DDIMER in the last 168 hours.  Radiology/Studies:  No results found.  Assessment and Plan:   CAD:  Subclinical with high calcium  score 419 for age no chest pain and normal pharmacologic stress test 05/19/19 Normal ECG update ETT HTN:  Well controlled.  Continue current medications and low sodium Dash type diet.   Anxiety:  Significant on lamictal latuda and welbutrin f/u primary / psychiatry  HLD:  Needs repeat labs on statin target LDL 70 or less Resume crestor  20 mg  PVD:  Post right CEA 2018  duplex 02/14/23 mild plaque no stenosis Complains of ? Claudication will order ABI's Ortho:  post C67 disc replacement  f/u Dr Vaughn Georges   ABI ETT Crestor  20 mg repeat labs 3 months    F/U in a year    For questions or updates, please contact [facility] Please consult [email or web address] for contact info under     Signed, Janelle Mediate, MD  06/04/2024 8:40 AM

## 2024-05-28 DIAGNOSIS — E782 Mixed hyperlipidemia: Secondary | ICD-10-CM | POA: Diagnosis not present

## 2024-05-28 DIAGNOSIS — I779 Disorder of arteries and arterioles, unspecified: Secondary | ICD-10-CM | POA: Diagnosis not present

## 2024-05-28 DIAGNOSIS — I2510 Atherosclerotic heart disease of native coronary artery without angina pectoris: Secondary | ICD-10-CM | POA: Diagnosis not present

## 2024-06-01 ENCOUNTER — Ambulatory Visit: Payer: Self-pay | Admitting: Cardiovascular Disease

## 2024-06-01 DIAGNOSIS — E782 Mixed hyperlipidemia: Secondary | ICD-10-CM

## 2024-06-01 DIAGNOSIS — I2510 Atherosclerotic heart disease of native coronary artery without angina pectoris: Secondary | ICD-10-CM

## 2024-06-01 DIAGNOSIS — I779 Disorder of arteries and arterioles, unspecified: Secondary | ICD-10-CM

## 2024-06-04 ENCOUNTER — Ambulatory Visit: Payer: Commercial Managed Care - PPO | Attending: Cardiovascular Disease | Admitting: Cardiovascular Disease

## 2024-06-04 ENCOUNTER — Other Ambulatory Visit: Payer: Self-pay

## 2024-06-04 ENCOUNTER — Other Ambulatory Visit (HOSPITAL_COMMUNITY): Payer: Self-pay

## 2024-06-04 ENCOUNTER — Encounter: Payer: Self-pay | Admitting: Cardiovascular Disease

## 2024-06-04 VITALS — BP 126/92 | HR 74 | Ht 73.0 in | Wt 217.0 lb

## 2024-06-04 DIAGNOSIS — I2510 Atherosclerotic heart disease of native coronary artery without angina pectoris: Secondary | ICD-10-CM

## 2024-06-04 DIAGNOSIS — Z9889 Other specified postprocedural states: Secondary | ICD-10-CM

## 2024-06-04 DIAGNOSIS — E782 Mixed hyperlipidemia: Secondary | ICD-10-CM

## 2024-06-04 DIAGNOSIS — I739 Peripheral vascular disease, unspecified: Secondary | ICD-10-CM | POA: Diagnosis not present

## 2024-06-04 MED ORDER — ROSUVASTATIN CALCIUM 20 MG PO TABS
20.0000 mg | ORAL_TABLET | Freq: Every day | ORAL | 3 refills | Status: AC
  Filled 2024-06-04: qty 90, 90d supply, fill #0
  Filled 2024-09-08: qty 90, 90d supply, fill #1
  Filled 2024-12-08: qty 90, 90d supply, fill #2

## 2024-06-04 NOTE — Patient Instructions (Addendum)
 Medication Instructions:  Your physician has recommended you make the following change in your medication:  1-Crestor  20 mg by mouth daily. *If you need a refill on your cardiac medications before your next appointment, please call your pharmacy*  Lab Work: Your physician recommends that you return for lab work in: 3 months for lipid and liver panel.  If you have labs (blood work) drawn today and your tests are completely normal, you will receive your results only by: MyChart Message (if you have MyChart) OR A paper copy in the mail If you have any lab test that is abnormal or we need to change your treatment, we will call you to review the results.  Testing/Procedures: Your physician has requested that you have an exercise tolerance test. For further information please visit [email or web address] Please also follow instruction sheet, as given.  Your physician has requested that you have an ankle brachial index (ABI) and lower extremities  duplex. During this test an ultrasound and blood pressure cuff are used to evaluate the arteries that supply the arms and legs with blood. Allow thirty minutes for this exam. There are no restrictions or special instructions.  Please note: We ask at that you not bring children with you during ultrasound (echo/ vascular) testing. Due to room size and safety concerns, children are not allowed in the ultrasound rooms during exams. Our front office staff cannot provide observation of children in our lobby area while testing is being conducted. An adult accompanying a patient to their appointment will only be allowed in the ultrasound room at the discretion of the ultrasound technician under special circumstances. We apologize for any inconvenience.  Follow-Up: At [facility], you and your health needs are our priority.  As part of our continuing mission to provide you with exceptional heart care, our providers are all part of one team.  This team includes  your primary Cardiologist (physician) and Advanced Practice Providers or APPs (Physician Assistants and Nurse Practitioners) who all work together to provide you with the care you need, when you need it.  Your next appointment:   6 month(s)  Provider:   Dr. Stann Earnest    We recommend signing up for the patient portal called MyChart.  Sign up information is provided on this After Visit Summary.  MyChart is used to connect with patients for Virtual Visits (Telemedicine).  Patients are able to view lab/test results, encounter notes, upcoming appointments, etc.  Non-urgent messages can be sent to your provider as well.   To learn more about what you can do with MyChart, go to [email or web address].

## 2024-06-05 ENCOUNTER — Other Ambulatory Visit (HOSPITAL_COMMUNITY): Payer: Self-pay

## 2024-06-07 LAB — LIPID PANEL
Chol/HDL Ratio: 3.4 ratio (ref 0.0–5.0)
Cholesterol, Total: 158 mg/dL (ref 100–199)
HDL: 46 mg/dL (ref >39)
LDL Chol Calc (NIH): 99 mg/dL (ref 0–99)
Triglycerides: 67 mg/dL (ref 0–149)
VLDL Cholesterol Cal: 13 mg/dL (ref 5–40)

## 2024-06-07 LAB — HEPATIC FUNCTION PANEL
ALT: 22 IU/L (ref 0–44)
AST: 16 IU/L (ref 0–40)
Albumin: 4.2 g/dL (ref 3.9–4.9)
Alkaline Phosphatase: 67 IU/L (ref 44–121)
Bilirubin Total: 0.3 mg/dL (ref 0.0–1.2)
Bilirubin, Direct: 0.13 mg/dL (ref 0.00–0.40)
Total Protein: 6.4 g/dL (ref 6.0–8.5)

## 2024-06-07 LAB — LIPOPROTEIN A (LPA)

## 2024-06-09 NOTE — Telephone Encounter (Signed)
 Patient got lab work done from previous order -  --- Message from Janelle Mediate sent at 05/21/2024  2:37 PM EDT ----- Does he have recent labs with a primary Needs lipid, liver and LpA  Will reorder lab work for 3 months and make sure LpA is included.

## 2024-06-18 ENCOUNTER — Other Ambulatory Visit: Payer: Self-pay | Admitting: Cardiovascular Disease

## 2024-06-19 ENCOUNTER — Ambulatory Visit: Payer: Self-pay | Admitting: Cardiovascular Disease

## 2024-06-19 LAB — LIPOPROTEIN A (LPA): Lipoprotein (a): 26.3 nmol/L (ref <75.0)

## 2024-07-09 ENCOUNTER — Encounter (HOSPITAL_COMMUNITY)

## 2024-07-16 ENCOUNTER — Ambulatory Visit: Payer: Self-pay | Admitting: Cardiovascular Disease

## 2024-07-16 ENCOUNTER — Ambulatory Visit (HOSPITAL_COMMUNITY)
Admission: RE | Admit: 2024-07-16 | Discharge: 2024-07-16 | Disposition: A | Discharge status: Home / Self Care | Source: Ambulatory Visit | Attending: Cardiovascular Disease | Admitting: Cardiovascular Disease

## 2024-07-16 ENCOUNTER — Ambulatory Visit (HOSPITAL_COMMUNITY)
Admission: RE | Admit: 2024-07-16 | Discharge: 2024-07-16 | Discharge status: Home / Self Care | Source: Ambulatory Visit | Attending: Cardiovascular Disease | Admitting: Cardiovascular Disease

## 2024-07-16 ENCOUNTER — Ambulatory Visit (HOSPITAL_BASED_OUTPATIENT_CLINIC_OR_DEPARTMENT_OTHER)
Admission: RE | Admit: 2024-07-16 | Discharge: 2024-07-16 | Disposition: A | Discharge status: Home / Self Care | Source: Ambulatory Visit | Attending: Cardiovascular Disease | Admitting: Cardiovascular Disease

## 2024-07-16 DIAGNOSIS — I2510 Atherosclerotic heart disease of native coronary artery without angina pectoris: Secondary | ICD-10-CM

## 2024-07-16 DIAGNOSIS — Z9889 Other specified postprocedural states: Secondary | ICD-10-CM

## 2024-07-16 DIAGNOSIS — E782 Mixed hyperlipidemia: Secondary | ICD-10-CM

## 2024-07-16 DIAGNOSIS — I739 Peripheral vascular disease, unspecified: Secondary | ICD-10-CM

## 2024-07-16 LAB — EXERCISE TOLERANCE TEST
Angina Index: 0
Duke Treadmill Score: 9
Estimated workload: 10.2
Exercise duration (min): 9 min
Exercise duration (sec): 7 s
MPHR: 157 {beats}/min
Peak HR: 151 {beats}/min
Percent HR: 96 %
RPE: 18
Rest HR: 73 {beats}/min
ST Depression (mm): 0 mm

## 2024-07-18 LAB — VAS US ABI WITH/WO TBI
Left ABI: 1.28
Right ABI: 1.26

## 2024-07-23 DIAGNOSIS — K219 Gastro-esophageal reflux disease without esophagitis: Secondary | ICD-10-CM | POA: Diagnosis not present

## 2024-07-23 DIAGNOSIS — Z0000 Encounter for general adult medical examination without abnormal findings: Secondary | ICD-10-CM | POA: Diagnosis not present

## 2024-07-23 DIAGNOSIS — I2510 Atherosclerotic heart disease of native coronary artery without angina pectoris: Secondary | ICD-10-CM | POA: Diagnosis not present

## 2024-07-23 DIAGNOSIS — Z8673 Personal history of transient ischemic attack (TIA), and cerebral infarction without residual deficits: Secondary | ICD-10-CM | POA: Diagnosis not present

## 2024-07-23 DIAGNOSIS — I779 Disorder of arteries and arterioles, unspecified: Secondary | ICD-10-CM | POA: Diagnosis not present

## 2024-07-23 DIAGNOSIS — Z6829 Body mass index (BMI) 29.0-29.9, adult: Secondary | ICD-10-CM | POA: Diagnosis not present

## 2024-07-23 DIAGNOSIS — E785 Hyperlipidemia, unspecified: Secondary | ICD-10-CM | POA: Diagnosis not present

## 2024-07-23 DIAGNOSIS — I10 Essential (primary) hypertension: Secondary | ICD-10-CM | POA: Diagnosis not present

## 2024-07-23 DIAGNOSIS — F329 Major depressive disorder, single episode, unspecified: Secondary | ICD-10-CM | POA: Diagnosis not present

## 2024-08-06 ENCOUNTER — Other Ambulatory Visit (HOSPITAL_BASED_OUTPATIENT_CLINIC_OR_DEPARTMENT_OTHER): Payer: Self-pay

## 2024-08-06 DIAGNOSIS — K219 Gastro-esophageal reflux disease without esophagitis: Secondary | ICD-10-CM | POA: Diagnosis not present

## 2024-08-06 DIAGNOSIS — K760 Fatty (change of) liver, not elsewhere classified: Secondary | ICD-10-CM | POA: Diagnosis not present

## 2024-08-06 DIAGNOSIS — Z860100 Personal history of colon polyps, unspecified: Secondary | ICD-10-CM | POA: Diagnosis not present

## 2024-08-06 DIAGNOSIS — E669 Obesity, unspecified: Secondary | ICD-10-CM | POA: Diagnosis not present

## 2024-08-27 ENCOUNTER — Other Ambulatory Visit (HOSPITAL_BASED_OUTPATIENT_CLINIC_OR_DEPARTMENT_OTHER): Payer: Self-pay

## 2024-08-27 DIAGNOSIS — L739 Follicular disorder, unspecified: Secondary | ICD-10-CM | POA: Diagnosis not present

## 2024-08-27 MED ORDER — CLINDAMYCIN PHOSPHATE 1 % EX SOLN
1.0000 | Freq: Two times a day (BID) | CUTANEOUS | 1 refills | Status: AC
  Filled 2024-08-27: qty 60, 30d supply, fill #0

## 2024-08-27 MED ORDER — DOXYCYCLINE HYCLATE 100 MG PO CAPS
100.0000 mg | ORAL_CAPSULE | Freq: Two times a day (BID) | ORAL | 0 refills | Status: AC
  Filled 2024-08-27: qty 28, 14d supply, fill #0

## 2024-09-11 ENCOUNTER — Other Ambulatory Visit (HOSPITAL_BASED_OUTPATIENT_CLINIC_OR_DEPARTMENT_OTHER): Payer: Self-pay

## 2024-09-11 ENCOUNTER — Other Ambulatory Visit (HOSPITAL_COMMUNITY): Payer: Self-pay

## 2024-10-09 ENCOUNTER — Other Ambulatory Visit (HOSPITAL_COMMUNITY): Payer: Self-pay

## 2024-10-09 MED ORDER — FLUZONE 0.5 ML IM SUSY
0.5000 mL | PREFILLED_SYRINGE | Freq: Once | INTRAMUSCULAR | 0 refills | Status: AC
  Filled 2024-10-09: qty 0.5, 1d supply, fill #0

## 2024-12-01 ENCOUNTER — Encounter (HOSPITAL_COMMUNITY): Payer: Self-pay

## 2025-01-11 ENCOUNTER — Ambulatory Visit: Admitting: Family Medicine

## 2025-01-12 ENCOUNTER — Other Ambulatory Visit (HOSPITAL_BASED_OUTPATIENT_CLINIC_OR_DEPARTMENT_OTHER): Payer: Self-pay

## 2025-01-12 MED ORDER — AMOXICILLIN 500 MG PO TABS
500.0000 mg | ORAL_TABLET | Freq: Three times a day (TID) | ORAL | 0 refills | Status: AC
  Filled 2025-01-12: qty 21, 6d supply, fill #0

## 2025-01-12 MED ORDER — METHYLPREDNISOLONE 4 MG PO TBPK
4.0000 mg | ORAL_TABLET | ORAL | 0 refills | Status: AC
  Filled 2025-01-12: qty 21, 6d supply, fill #0

## 2025-01-22 ENCOUNTER — Other Ambulatory Visit (HOSPITAL_BASED_OUTPATIENT_CLINIC_OR_DEPARTMENT_OTHER): Payer: Self-pay

## 2025-01-22 MED ORDER — MELOXICAM 15 MG PO TABS
15.0000 mg | ORAL_TABLET | Freq: Every day | ORAL | 0 refills | Status: AC
  Filled 2025-01-22: qty 30, 30d supply, fill #0

## 2025-01-25 ENCOUNTER — Ambulatory Visit: Admitting: Family Medicine

## 2025-03-08 ENCOUNTER — Ambulatory Visit: Admitting: Family Medicine
